# Patient Record
Sex: Male | Born: 1950 | Race: White | Hispanic: No | Marital: Married | Smoking: Never smoker
Health system: Southern US, Community
[De-identification: ages and names within clinical notes are randomized; demographics above are authoritative.]

## PROBLEM LIST (undated history)

## (undated) DIAGNOSIS — K649 Unspecified hemorrhoids: Secondary | ICD-10-CM

## (undated) DIAGNOSIS — Z87442 Personal history of urinary calculi: Secondary | ICD-10-CM

## (undated) DIAGNOSIS — K219 Gastro-esophageal reflux disease without esophagitis: Secondary | ICD-10-CM

## (undated) DIAGNOSIS — I10 Essential (primary) hypertension: Secondary | ICD-10-CM

## (undated) DIAGNOSIS — E785 Hyperlipidemia, unspecified: Secondary | ICD-10-CM

## (undated) DIAGNOSIS — E119 Type 2 diabetes mellitus without complications: Secondary | ICD-10-CM

## (undated) HISTORY — DX: Personal history of urinary calculi: Z87.442

## (undated) HISTORY — DX: Unspecified hemorrhoids: K64.9

## (undated) HISTORY — DX: Gastro-esophageal reflux disease without esophagitis: K21.9

## (undated) HISTORY — PX: COLONOSCOPY: SHX174

## (undated) HISTORY — PX: OTHER SURGICAL HISTORY: SHX169

## (undated) HISTORY — PX: LITHOTRIPSY: SUR834

## (undated) HISTORY — DX: Hyperlipidemia, unspecified: E78.5

## (undated) HISTORY — DX: Type 2 diabetes mellitus without complications: E11.9

---

## 2019-04-03 ENCOUNTER — Other Ambulatory Visit: Payer: Self-pay

## 2019-04-03 ENCOUNTER — Emergency Department (HOSPITAL_COMMUNITY): Payer: Medicare Other

## 2019-04-03 ENCOUNTER — Inpatient Hospital Stay (HOSPITAL_COMMUNITY)
Admission: EM | Admit: 2019-04-03 | Discharge: 2019-04-07 | DRG: 312 | Disposition: A | Discharge status: Home / Self Care | Payer: Medicare Other | Attending: Internal Medicine | Admitting: Internal Medicine

## 2019-04-03 ENCOUNTER — Encounter (HOSPITAL_COMMUNITY): Payer: Self-pay | Admitting: Emergency Medicine

## 2019-04-03 DIAGNOSIS — S0001XA Abrasion of scalp, initial encounter: Secondary | ICD-10-CM | POA: Diagnosis present

## 2019-04-03 DIAGNOSIS — I951 Orthostatic hypotension: Secondary | ICD-10-CM | POA: Diagnosis not present

## 2019-04-03 DIAGNOSIS — R55 Syncope and collapse: Secondary | ICD-10-CM

## 2019-04-03 DIAGNOSIS — Z87442 Personal history of urinary calculi: Secondary | ICD-10-CM

## 2019-04-03 DIAGNOSIS — R011 Cardiac murmur, unspecified: Secondary | ICD-10-CM | POA: Diagnosis present

## 2019-04-03 DIAGNOSIS — I10 Essential (primary) hypertension: Secondary | ICD-10-CM | POA: Insufficient documentation

## 2019-04-03 DIAGNOSIS — Z20828 Contact with and (suspected) exposure to other viral communicable diseases: Secondary | ICD-10-CM | POA: Diagnosis present

## 2019-04-03 DIAGNOSIS — W010XXA Fall on same level from slipping, tripping and stumbling without subsequent striking against object, initial encounter: Secondary | ICD-10-CM | POA: Diagnosis present

## 2019-04-03 DIAGNOSIS — S7002XA Contusion of left hip, initial encounter: Secondary | ICD-10-CM

## 2019-04-03 DIAGNOSIS — Z8249 Family history of ischemic heart disease and other diseases of the circulatory system: Secondary | ICD-10-CM

## 2019-04-03 DIAGNOSIS — K219 Gastro-esophageal reflux disease without esophagitis: Secondary | ICD-10-CM | POA: Diagnosis present

## 2019-04-03 DIAGNOSIS — D62 Acute posthemorrhagic anemia: Secondary | ICD-10-CM

## 2019-04-03 HISTORY — DX: Essential (primary) hypertension: I10

## 2019-04-03 HISTORY — DX: Personal history of urinary calculi: Z87.442

## 2019-04-03 LAB — CBC WITH DIFFERENTIAL/PLATELET
Abs Immature Granulocytes: 0.12 10*3/uL — ABNORMAL HIGH (ref 0.00–0.07)
Abs Immature Granulocytes: 0.06 10*3/uL (ref 0.00–0.07)
Basophils Absolute: 0 10*3/uL (ref 0.0–0.1)
Basophils Absolute: 0 10*3/uL (ref 0.0–0.1)
Basophils Relative: 0 %
Basophils Relative: 0 %
Eosinophils Absolute: 0 10*3/uL (ref 0.0–0.5)
Eosinophils Absolute: 0 10*3/uL (ref 0.0–0.5)
Eosinophils Relative: 0 %
Eosinophils Relative: 0 %
HCT: 38.2 % — ABNORMAL LOW (ref 39.0–52.0)
HCT: 36.3 % — ABNORMAL LOW (ref 39.0–52.0)
Hemoglobin: 12.3 g/dL — ABNORMAL LOW (ref 13.0–17.0)
Hemoglobin: 11.9 g/dL — ABNORMAL LOW (ref 13.0–17.0)
Immature Granulocytes: 1 %
Immature Granulocytes: 0 %
Lymphocytes Relative: 6 %
Lymphocytes Relative: 8 %
Lymphs Abs: 0.9 10*3/uL (ref 0.7–4.0)
Lymphs Abs: 1.1 10*3/uL (ref 0.7–4.0)
MCH: 29.3 pg (ref 26.0–34.0)
MCH: 29.5 pg (ref 26.0–34.0)
MCHC: 32.2 g/dL (ref 30.0–36.0)
MCHC: 32.8 g/dL (ref 30.0–36.0)
MCV: 91 fL (ref 80.0–100.0)
MCV: 89.9 fL (ref 80.0–100.0)
Monocytes Absolute: 0.8 10*3/uL (ref 0.1–1.0)
Monocytes Absolute: 0.9 10*3/uL (ref 0.1–1.0)
Monocytes Relative: 5 %
Monocytes Relative: 6 %
Neutro Abs: 14.9 10*3/uL — ABNORMAL HIGH (ref 1.7–7.7)
Neutro Abs: 12.5 10*3/uL — ABNORMAL HIGH (ref 1.7–7.7)
Neutrophils Relative %: 88 %
Neutrophils Relative %: 86 %
Platelets: 303 10*3/uL (ref 150–400)
Platelets: 335 10*3/uL (ref 150–400)
RBC: 4.2 MIL/uL — ABNORMAL LOW (ref 4.22–5.81)
RBC: 4.04 MIL/uL — ABNORMAL LOW (ref 4.22–5.81)
RDW: 13.1 % (ref 11.5–15.5)
RDW: 13.1 % (ref 11.5–15.5)
WBC: 16.8 10*3/uL — ABNORMAL HIGH (ref 4.0–10.5)
WBC: 14.6 10*3/uL — ABNORMAL HIGH (ref 4.0–10.5)
nRBC: 0 % (ref 0.0–0.2)
nRBC: 0 % (ref 0.0–0.2)

## 2019-04-03 LAB — BASIC METABOLIC PANEL
Anion gap: 11 (ref 5–15)
BUN: 19 mg/dL (ref 8–23)
CO2: 24 mmol/L (ref 22–32)
Calcium: 9 mg/dL (ref 8.9–10.3)
Chloride: 104 mmol/L (ref 98–111)
Creatinine, Ser: 1.08 mg/dL (ref 0.61–1.24)
GFR calc Af Amer: >60 mL/min (ref >60)
GFR calc non Af Amer: >60 mL/min (ref >60)
Glucose, Bld: 109 mg/dL — ABNORMAL HIGH (ref 70–99)
Potassium: 4.2 mmol/L (ref 3.5–5.1)
Sodium: 139 mmol/L (ref 135–145)

## 2019-04-03 LAB — CBG MONITORING, ED: Glucose-Capillary: 104 mg/dL — ABNORMAL HIGH (ref 70–99)

## 2019-04-03 LAB — HIV ANTIBODY (ROUTINE TESTING W REFLEX): HIV Screen 4th Generation wRfx: NONREACTIVE

## 2019-04-03 MED ORDER — ACETAMINOPHEN 650 MG RE SUPP: 650.0000 mg | Freq: Four times a day (QID) | RECTAL | Status: DC | PRN

## 2019-04-03 MED ORDER — HYDROCODONE-ACETAMINOPHEN 5-325 MG PO TABS
1.0000 | ORAL_TABLET | Freq: Four times a day (QID) | ORAL | Status: DC | PRN
  Administered 2019-04-05 (×2): 1 via ORAL
  Filled 2019-04-03 (×2): qty 1

## 2019-04-03 MED ORDER — SODIUM CHLORIDE 0.9% FLUSH
3.0000 mL | Freq: Two times a day (BID) | INTRAVENOUS | Status: DC
  Administered 2019-04-03 – 2019-04-06 (×5): 3 mL via INTRAVENOUS

## 2019-04-03 MED ORDER — ACETAMINOPHEN 325 MG PO TABS: 650.0000 mg | ORAL_TABLET | Freq: Four times a day (QID) | ORAL | Status: DC | PRN

## 2019-04-03 MED ORDER — IOHEXOL 300 MG/ML  SOLN
100.0000 mL | Freq: Once | INTRAMUSCULAR | Status: AC | PRN
  Administered 2019-04-03: 100 mL via INTRAVENOUS

## 2019-04-03 MED ORDER — SODIUM CHLORIDE 0.9 % IV BOLUS
1000.0000 mL | Freq: Once | INTRAVENOUS | Status: AC
  Administered 2019-04-03: 1000 mL via INTRAVENOUS

## 2019-04-03 NOTE — H&P (Addendum)
Date: 04/03/2019               Patient Name:  Daniel Mcdowell MRN: [id number]  DOB: 12-Oct-1950 Age / Sex: 68 y.o., male   PCP: Dois Davenport, MD         Medical Service: Internal Medicine Teaching Service         Attending Physician: Dr. Reymundo Poll, MD    First Contact: Ephriam Knuckles, MD, Rylee Pager: [phone])  Second Contact: Cleaster Corin DO, Jaimie PagerDuane Lope [phone])       After Hours (After 5p/  First Contact Pager: [phone]  weekends / holidays): Second Contact Pager: [phone]   Chief Complaint: left hip pain  History of Present Illness: 68 y.o. male w/ PMHx of hypertension and nephrolithiasis presenting with left hip pain and bruising following a mechanical fall earlier today. History obtained by patient and chart review. Patient reports that he was in his usual state of health until earlier today when he was going down his wooden steps that were covered with wet leaves and slipped. He fell onto the left side and landed on his left lateral hip and left elbow. He denies any head trauma or loss of consciousness with this fall. He was able to ambulate back into the house without any difficulty. He has been icing to the left hip which reportedly helped with the pain. Shortly after that, when he was in the bathroom he had an episode where he felt diaphoretic and was told by his wife that he looked clammy. He had another fall and hit his head on the back of the door. He does not believe he lost consciousness with this fall. He denies any fevers/chills, headache, lightheadedness/dizziness, chest pain, shortness of breath, nausea/vomiting, abdominal pain, changes in bowel habits, urinary symptoms, generalized or focal weakness.   At baseline, patient is active and denies any prior syncopal episodes, chest pain or dyspnea on exertion.    ED Course:  Patient presented with significant swelling to left lateral hip after mechanical fall with a near-syncopal episode causing him to  hit the back of his head on the door. No restrictions in ROM or ambulation. In the ED, nurse noted patient to have syncopal episode; however, when questioned, patient stated that he was just resting his eyes. X-ray of left hip negative for fracture. CT imaging concerning for large soft tissue hematoma without active extravasation. However, given multiple near-syncopal episodes, patient admitted to internal medicine for further evaluation and management.    Meds:  No outpatient medications have been marked as taking for the 04/03/19 encounter [facility] Encounter).  Lisinopril 40mg  qd Aspiring 81mg  qd Omeprazole  Multivitamins    Allergies: Allergies as of 04/03/2019   (Not on File)   Past Medical History:  Diagnosis Date   History of nephrolithiasis    Hypertension     Family History:  Family History  Problem Relation Age of Onset   Heart attack Father    Diabetes Sister    COPD Sister      Social History:  Social History   Tobacco Use   Smoking status: Never Smoker  Substance Use Topics   Alcohol use: Yes    Comment: 1 beer every 6 months or so    Drug use: Never     Review of Systems: A complete ROS was negative except as per HPI.   Physical Exam: Blood pressure 111/81, pulse (!) 108, temperature 99.7 F (37.6 C), temperature source Oral, resp. rate 17,  SpO2 97 %. Physical Exam Vitals signs and nursing note reviewed.  Constitutional:      General: He is not in acute distress.    Appearance: Normal appearance. He is not ill-appearing or diaphoretic.  HENT:     Head: Normocephalic and atraumatic.     Mouth/Throat:     Mouth: Mucous membranes are moist.     Pharynx: Oropharynx is clear. No oropharyngeal exudate or posterior oropharyngeal erythema.  Eyes:     General: No scleral icterus.    Extraocular Movements: Extraocular movements intact.     Conjunctiva/sclera: Conjunctivae normal.  Neck:     Musculoskeletal: Normal range of motion and neck  supple. No muscular tenderness.  Cardiovascular:     Rate and Rhythm: Regular rhythm. Tachycardia present.     Pulses: Normal pulses.     Heart sounds: Murmur present. No friction rub. No gallop.      Comments: Systolic murmur in 2nd ICS on left  Pulmonary:     Effort: Pulmonary effort is normal. No respiratory distress.     Breath sounds: Normal breath sounds. No wheezing, rhonchi or rales.  Abdominal:     General: Bowel sounds are normal. There is no distension.     Palpations: Abdomen is soft.     Tenderness: There is no abdominal tenderness. There is no guarding or rebound.  Musculoskeletal: Normal range of motion.        General: Tenderness present. No swelling or deformity.  Skin:    General: Skin is warm and dry.     Capillary Refill: Capillary refill takes less than 2 seconds.     Findings: Bruising present.     Comments: Mild bruising noted at left medial elbow without significant tenderness to palpation or restrictions in ROM  Left thigh with significant bruising (see picture below) that is firm with tenderness to palpation; however no restriction in ROM   Neurological:     General: No focal deficit present.     Mental Status: He is alert and oriented to person, place, and time. Mental status is at baseline.     Cranial Nerves: No cranial nerve deficit.     Sensory: No sensory deficit.     Motor: No weakness.     Coordination: Coordination normal.     Gait: Gait normal.      EKG: personally reviewed my interpretation is normal sinus rhythm  CT HEAD WO CONTRAST: IMPRESSION: 1. No acute intracranial abnormality. 2. Mild occipital and lower right parietal scalp infiltration. 3. Debris within the external auditory canals, likely cerumen impaction.  X-RAY LEFT HIP: Negative for hip fracture or dislocation.   CT LEFT HIP W CONTRAST:  IMPRESSION: 1. Large 11.4 x 4.5 x 18.5 cm superficial soft tissue hematoma overlying the greater trochanter and proximal  subtrochanteric femur. No contrast extravasation. 2.  No acute osseous abnormality.  Assessment & Plan by Problem:  Patient is a 68yo male with history of HTN and nephrolithiasis presenting after a mechanical fall resulting in a left hip hematoma and multiple subsequent presyncopal episodes likely vasovagal in nature.   Presyncopal episode:  Patient reports that he had a near syncopal episode after using the bathroom at home when he was noted to be diaphoretic and "clammy" by his wife. He reports falling and hitting the back of his head during this episode but denies any LOC. In the ED, patient presumably had a near-syncopal episode according to nursing but reports that he was just resting his eyes. On exam,  patient is tachycardic but otherwise hemodynamically stable. Likely this was vasovagal in nature. However, patient does have new systolic murmur at left second intercostal space so will r/o any valvular causes.  - Cardiac monitoring - Echo  Left hip hematoma:  Patient has a large superficial soft tissue hematoma overlying the greater trochanter proximal femur without active extravasation after a mechanical fall. He is not on any anticoagulation. Pain is improved with icing. Although firm and tender to palpation, there is no restrictions in the patient's ROM of the left hip. Distal pulses palpable. He is able to ambulate without any significant discomfort.  - CBC - PT/OT evaluation - Ice to affected area  - Hydrocodone-acetaminophen 1 tablet q6h prn  History of hypertension:  Patient has history of hypertension and reports taking lisinopril 40mg  qd. Patient is normotensive on admission. - Holding BP meds for now  Diet: Heart healthy Code: FULL  VTE Prophylaxis: SCDs   Dispo: Admit patient to Observation with expected length of stay less than 2 midnights.  Signed: Harvie Heck, MD  Internal Medicine, PGY-1  04/03/2019, 10:07 PM  Pager: [phone]

## 2019-04-03 NOTE — ED Provider Notes (Signed)
[facility]   CSN: [id number] Arrival date & time: 04/03/19  1427     History   Chief Complaint Chief Complaint  Patient presents with   Fall    HPI Daniel Mcdowell is a 68 y.o. male without significant PMHx, presenting to the ED with left hip pain and bruising after a mechanical fall earlier today.  States he was walking outside on his back steps in the rain and they were covered with wet leaves.  He slipped and fell onto his left side landing on his left lateral hip and elbow.  He states he had sudden pain in the left hip was able to ambulate back to his house.  When he was in the restroom shortly after he had a near syncopal episode with lightheadedness and fell backwards hitting his head on the door.  He states he does not believe he lost full consciousness.  He is not complaining of headache, vision changes, nausea or vomiting, neck or back pain, numbness or weakness in extremities.  He has applied ice to his left hip for symptoms..  Patient is not on anticoagulation.     The history is provided by the patient.    History reviewed. No pertinent past medical history.  There are no active problems to display for this patient.   History reviewed. No pertinent surgical history.      Home Medications    Prior to Admission medications   Not on File    Family History No family history on file.  Social History Social History   Tobacco Use   Smoking status: Not on file  Substance Use Topics   Alcohol use: Not on file   Drug use: Not on file     Allergies   Patient has no allergy information on record.   Review of Systems Review of Systems  All other systems reviewed and are negative.    Physical Exam Updated Vital Signs BP 124/77    Pulse 96    Temp 98.2 F (36.8 C) (Oral)    Resp 19    SpO2 97%   Physical Exam Vitals signs and nursing note reviewed.  Constitutional:      General: He is not in  acute distress.    Appearance: He is well-developed.  HENT:     Head: Normocephalic.     Comments: Superficial abrasion to right occipital scalp.  No large hematoma or tenderness. Eyes:     Conjunctiva/sclera: Conjunctivae normal.  Neck:     Musculoskeletal: Normal range of motion and neck supple. No muscular tenderness.  Cardiovascular:     Rate and Rhythm: Normal rate and regular rhythm.  Pulmonary:     Effort: Pulmonary effort is normal. No respiratory distress.     Breath sounds: Normal breath sounds.  Abdominal:     General: Bowel sounds are normal.     Palpations: Abdomen is soft.     Tenderness: There is no abdominal tenderness.  Musculoskeletal:     Comments: No midline spinal or paraspinal tenderness, no bony step-offs or gross deformities.  Left elbow just distal to the olecranon process with hematoma and bruising.  Mild tenderness, full normal range of motion of the elbow without pain. Left lateral hip/proximal thigh with large swelling and an area of ecchymosis.  The swelling extends from just below the iliac crest to the proximal lateral left thigh.  It is very firm to palpation.  Slightly tender.  No pain in  the groin with internal and external rotation of the hip and patient is able to fully range the hip without difficulty.  No obvious deformity.  Skin:    General: Skin is warm.  Neurological:     Mental Status: He is alert.     Comments: Mental Status:  Alert, oriented, thought content appropriate, able to give a coherent history. Speech fluent without evidence of aphasia. Able to follow 2 step commands without difficulty.  Cranial Nerves:  II:  Peripheral visual fields grossly normal, pupils equal, round, reactive to light III,IV, VI: ptosis not present, extra-ocular motions intact bilaterally  V,VII: smile symmetric, facial light touch sensation equal VIII: hearing grossly normal to voice  X: uvula elevates symmetrically  XI: bilateral shoulder shrug symmetric and  strong XII: midline tongue extension without fassiculations Motor:  Normal tone. 5/5 in upper and lower extremities bilaterally including strong and equal grip strength and dorsiflexion/plantar flexion Sensory: grossly normal in all extremities.  Cerebellar: normal finger-to-nose with bilateral upper extremities Gait: normal gait and balance CV: distal pulses palpable throughout    Psychiatric:        Behavior: Behavior normal.      ED Treatments / Results  Labs (all labs ordered are listed, but only abnormal results are displayed) Labs Reviewed  CBC WITH DIFFERENTIAL/PLATELET - Abnormal; Notable for the following components:      Result Value   WBC 16.8 (*)    RBC 4.20 (*)    Hemoglobin 12.3 (*)    HCT 38.2 (*)    Neutro Abs 14.9 (*)    Abs Immature Granulocytes 0.12 (*)    All other components within normal limits  BASIC METABOLIC PANEL - Abnormal; Notable for the following components:   Glucose, Bld 109 (*)    All other components within normal limits  CBG MONITORING, ED - Abnormal; Notable for the following components:   Glucose-Capillary 104 (*)    All other components within normal limits    EKG EKG Interpretation  Date/Time:  Thursday April 03 2019 16:59:52 EDT Ventricular Rate:  99 PR Interval:    QRS Duration: 100 QT Interval:  356 QTC Calculation: 457 R Axis:   77 Text Interpretation: Sinus rhythm Baseline wander in lead(s) II III aVL aVF no prior available for comparison Confirmed by Tilden Fossa [phone]) on 04/03/2019 6:56:58 PM   Radiology Dg Hip Unilat With Pelvis 2-3 Views Left  Result Date: 04/03/2019 CLINICAL DATA:  Left leg pain after fall EXAM: DG HIP (WITH OR WITHOUT PELVIS) 2-3V LEFT COMPARISON:  None. FINDINGS: There is no evidence of hip fracture or dislocation. There is no evidence of arthropathy or other focal bone abnormality. IMPRESSION: Negative. Electronically Signed   By: Duanne Guess M.D.   On: 04/03/2019 15:04     Procedures Procedures (including critical care time)  Medications Ordered in ED Medications  iohexol (OMNIPAQUE) 300 MG/ML solution 100 mL (100 mLs Intravenous Contrast Given 04/03/19 1854)     Initial Impression / Assessment and Plan / ED Course  I have reviewed the triage vital signs and the nursing notes.  Pertinent labs & imaging results that were available during my care of the patient were reviewed by me and considered in my medical decision making (see chart for details).       Patient presenting with significant swelling to left lateral hip after a mechanical fall earlier today.  He then had a near syncopal episode that caused him to strike the back of his head on  a door that was preceded by lightheadedness afterwards.  On exam, he is well-appearing and in no distress.  He has been able to ambulate on the left leg since the injury.  There is significant swelling with a large area of ecchymosis to the lateral left hip, and is very firm to the touch.  Patient is able to range the hip without difficulty.  Plain film of the left hip is negative for fracture.  Given patient's significant swelling on exam, patient was discussed with Dr. Ralene Bathe who evaluated patient at bedside.  We will proceed with labs and advanced imaging.  Concern for active extravasation of hematoma.  Patient has not requiring any pain medication at this time.  Ice applied for swelling.    Labs reveal leukocytosis of 16.8, hemoglobin of 12.3, no significant electrolyte derangements.  Vital signs remained stable. Care assumed at shift change by PA Couture, pending CT imaging and re-evaluation.  Final Clinical Impressions(s) / ED Diagnoses   Final diagnoses:  None    ED Discharge Orders    None       Javaya [place], [place] N, PA-C 04/03/19 1904    Quintella Reichert, MD 04/05/19 1819

## 2019-04-03 NOTE — Progress Notes (Signed)
MD notified of patient condition

## 2019-04-03 NOTE — Progress Notes (Addendum)
The nurse was unable to complete the standing orthostatic vitals. Patient passed out while standing, was sweating excessively. Placed on 2 liters of oxygen. RR was called. Patient says I feel normal now.

## 2019-04-03 NOTE — ED Triage Notes (Signed)
Pt to ER for evaluation after falling down some wet steps at his home. Reports pain to left leg, is able to ambulate however. Also has small abrasion to posterior head. No LOC reported. A/o x4 at this time. Not on anticoagulation.

## 2019-04-03 NOTE — ED Notes (Signed)
Transported to CT 

## 2019-04-03 NOTE — Significant Event (Signed)
Rapid Response Event Note  Overview:Called d/t syncopal episode when standing for orthostatic VS. Per RN, pt got diaphoretic, and passed out for a few seconds once he stood up to get orthostatic VS. BP was 100/74 while laying in the bed and had dropped to 81/55 while sitting, then pt stood and passed out.  When pt woke up, he was alert and oriented, but didn't remember what had happened. RR RN asked RN to lay pt back in the back and repeat BP. Pt here for near-syncopal event/fall at home yielding large L hip hematoma. Time Called: 2222 Arrival Time: 2230 Event Type: Neurologic, Hypotension  Initial Focused Assessment: Pt laying in the bed, alert and oriented, concerned about what just happened. He says he doesn't remember what happened but he does remember having a lot of pain in his L hip once he stood up. EKG strip reviewed with no change in HR, though there was a brief period of time during event where pt was off the monitor. Lungs clear r/o, murmur heard (pt has no history of murmur), skin warm and dry.  Interventions: 1L NS bolus x 1 Bedrest tonight MD at bedside to assess pt Plan of Care (if not transferred): Please keep pt on bedrest tonight, continue to monitor, and call RRT if further assistance needed. Event Summary: Name of Physician Notified: Aslam, MD at 2240    at    Outcome: Stayed in room and stabalized  Event End Time: 2250  Dillard [place]

## 2019-04-03 NOTE — Progress Notes (Signed)
Paged by RN regarding patient becoming diaphoretic and "passing out" when trying to perform orthostatic vitals. BP was 100/74 while laying and dropped to 81/55 when sitting. On standing, patient became diaphoretic and passed out. No change in HR. RR was called who started patient on 1L bolus. Patient assessed at bedside. He is resting in bed and is alert and oriented. VSS. He reports experiencing pain in his left leg on standing, became sweaty and had to sit down. Physical exam is unchanged from prior and continues to have systolic murmur at left 2nd ICS unchanged in intensity. No significant change in the size of the hematoma. Bedside EKG with NSR. Will continue the bolus and continue on bedrest.  Suspect this is vasovagal as diaphoresis and syncope triggered by pain. Will continue to monitor.

## 2019-04-03 NOTE — ED Provider Notes (Signed)
Care assumed from Martinique Robinson, PA-C at shift change pending CT of the left hip and reassessment.  See her note for full H&P  Per her note, "Daniel Mcdowell is a 68 y.o. male without significant PMHx, presenting to the ED with left hip pain and bruising after a mechanical fall earlier today.  States he was walking outside on his back steps in the rain and they were covered with wet leaves.  He slipped and fell onto his left side landing on his left lateral hip and elbow.  He states he had sudden pain in the left hip was able to ambulate back to his house.  When he was in the restroom shortly after he had a near syncopal episode with lightheadedness and fell backwards hitting his head on the door.  He states he does not believe he lost full consciousness.  He is not complaining of headache, vision changes, nausea or vomiting, neck or back pain, numbness or weakness in extremities.  He has applied ice to his left hip for symptoms..  Patient is not on anticoagulation."    Walking outside, slippery steps and fall on left hip. Large amount of swelling on hip.  Near syncope, fall and hit head on door.  Ralene Bathe has seen  Labs, ekg are fine  CT left hip to r/o extravasation of hematoma, if neg and if he feels ok maybe discharge vs admit for obs for syncope  Physical Exam  BP 124/77   Pulse 96   Temp 98.2 F (36.8 C) (Oral)   Resp 19   SpO2 97%   Physical Exam Vitals signs and nursing note reviewed.  Constitutional:      Appearance: He is well-developed.  HENT:     Head: Normocephalic and atraumatic.  Eyes:     Conjunctiva/sclera: Conjunctivae normal.  Neck:     Musculoskeletal: Neck supple.  Cardiovascular:     Rate and Rhythm: Regular rhythm. Tachycardia present.     Heart sounds: Murmur present.  Pulmonary:     Effort: Pulmonary effort is normal. No respiratory distress.     Breath sounds: Normal breath sounds.  Abdominal:     Palpations: Abdomen is soft.  Musculoskeletal:     Comments:  Mild tenderness to the left hip with extensive swelling and firm hematoma noted from the left buttock to the left upper thigh.  Also with swelling/ecchymosis to the left elbow.  Good range of motion of the extremities.  Skin:    General: Skin is warm and dry.  Neurological:     Mental Status: He is alert.       ED Course/Procedures     Procedures Results for orders placed or performed during the hospital encounter of 04/03/19  CBC with Differential  Result Value Ref Range   WBC 16.8 (H) 4.0 - 10.5 K/uL   RBC 4.20 (L) 4.22 - 5.81 MIL/uL   Hemoglobin 12.3 (L) 13.0 - 17.0 g/dL   HCT 38.2 (L) 39.0 - 52.0 %   MCV 91.0 80.0 - 100.0 fL   MCH 29.3 26.0 - 34.0 pg   MCHC 32.2 30.0 - 36.0 g/dL   RDW 13.1 11.5 - 15.5 %   Platelets 303 150 - 400 K/uL   nRBC 0.0 0.0 - 0.2 %   Neutrophils Relative % 88 %   Neutro Abs 14.9 (H) 1.7 - 7.7 K/uL   Lymphocytes Relative 6 %   Lymphs Abs 0.9 0.7 - 4.0 K/uL   Monocytes Relative 5 %  Monocytes Absolute 0.8 0.1 - 1.0 K/uL   Eosinophils Relative 0 %   Eosinophils Absolute 0.0 0.0 - 0.5 K/uL   Basophils Relative 0 %   Basophils Absolute 0.0 0.0 - 0.1 K/uL   Immature Granulocytes 1 %   Abs Immature Granulocytes 0.12 (H) 0.00 - 0.07 K/uL  Basic metabolic panel  Result Value Ref Range   Sodium 139 135 - 145 mmol/L   Potassium 4.2 3.5 - 5.1 mmol/L   Chloride 104 98 - 111 mmol/L   CO2 24 22 - 32 mmol/L   Glucose, Bld 109 (H) 70 - 99 mg/dL   BUN 19 8 - 23 mg/dL   Creatinine, Ser 6.961.08 0.61 - 1.24 mg/dL   Calcium 9.0 8.9 - 29.510.3 mg/dL   GFR calc non Af Amer >60 >60 mL/min   GFR calc Af Amer >60 >60 mL/min   Anion gap 11 5 - 15  CBG monitoring, ED  Result Value Ref Range   Glucose-Capillary 104 (H) 70 - 99 mg/dL   Ct Head Wo Contrast  Result Date: 04/03/2019 CLINICAL DATA:  Head trauma, fall, posterior abrasion EXAM: CT HEAD WITHOUT CONTRAST TECHNIQUE: Contiguous axial images were obtained from the base of the skull through the vertex without  intravenous contrast. COMPARISON:  None. FINDINGS: Brain: No evidence of acute infarction, hemorrhage, hydrocephalus, extra-axial collection or mass lesion/mass effect. Symmetric prominence of the ventricles, cisterns and sulci compatible with parenchymal volume loss. Patchy areas of white matter hypoattenuation are most compatible with chronic microvascular angiopathy. Vascular: Atherosclerotic calcification of the carotid siphons. No hyperdense vessel. Skull: Mild occipital and right parietal scalp infiltration. No subjacent calvarial suspicious osseous lesion. Punctate subcutaneous mineralization seen more superiorly towards the right parietal convexity. Sinuses/Orbits: Mild nodular pansinus mural thickening. No air-fluid levels. Mastoid air cells are clear. Middle ear cavities are clear. Impacted debris within the external auditory canals. Included orbital structures are unremarkable. Other: None IMPRESSION: 1. No acute intracranial abnormality. 2. Mild occipital and lower right parietal scalp infiltration. 3. Debris within the external auditory canals, likely cerumen impaction. Electronically Signed   By: Kreg ShropshirePrice  DeHay M.D.   On: 04/03/2019 19:20   Ct Hip Left W Contrast  Result Date: 04/03/2019 CLINICAL DATA:  Left leg pain after fall. EXAM: CT OF THE LOWER LEFT EXTREMITY WITH CONTRAST TECHNIQUE: Multidetector CT imaging of the left hip was performed according to the standard protocol following intravenous contrast administration. COMPARISON:  Left hip x-rays from same day. CONTRAST:  100mL OMNIPAQUE IOHEXOL 300 MG/ML  SOLN FINDINGS: Bones/Joint/Cartilage No fracture or dislocation. Normal alignment. No joint effusion. Ligaments Ligaments are suboptimally evaluated by CT. Muscles and Tendons Grossly intact.  No muscle atrophy. Soft tissue Large 11.4 x 4.5 x 18.5 cm superficial soft tissue hematoma overlying the greater trochanter and proximal subtrochanteric femur. Overlying superficial soft tissue  swelling and hemorrhage. No active extravasation. Small fat containing left inguinal hernia. IMPRESSION: 1. Large 11.4 x 4.5 x 18.5 cm superficial soft tissue hematoma overlying the greater trochanter and proximal subtrochanteric femur. No contrast extravasation. 2.  No acute osseous abnormality. Electronically Signed   By: Obie DredgeWilliam T Derry M.D.   On: 04/03/2019 19:33   Dg Hip Unilat With Pelvis 2-3 Views Left  Result Date: 04/03/2019 CLINICAL DATA:  Left leg pain after fall EXAM: DG HIP (WITH OR WITHOUT PELVIS) 2-3V LEFT COMPARISON:  None. FINDINGS: There is no evidence of hip fracture or dislocation. There is no evidence of arthropathy or other focal bone abnormality. IMPRESSION: Negative. Electronically Signed  By: Duanne Guess M.D.   On: 04/03/2019 15:04     MDM   Briefly, 68 year old male presenting initially for mechanical fall and left hip pain.  However after the fall, he had a syncopal episode and another near syncopal episode where he sustained head trauma.  He has some extensive hematoma to the left hip and at signout he was pending CT imaging of the left hip to rule out extravasation into the joint.  CT showed a large superficial soft tissue hematoma overlying the greater trochanter and proximal subtrochanteric femur.  No contracts extravasation.  The remainder of patient's work-up was largely benign.  Given his multiple near syncopal/syncopal episodes following this injury patient was admitted for observation.  Case was discussed with Dr. Madilyn Hook who personally evaluated the patient and also recommends admitting the patient for syncope.  8:41 PM discussed case with Dr. Evelene Croon with internal medicine residency who accepts patient for admission.  She does recommend adding repeat CBC at this time.     Rayne Du 04/03/19 2047    Tilden Fossa, MD 04/05/19 Windy Fast

## 2019-04-04 ENCOUNTER — Other Ambulatory Visit: Payer: Self-pay

## 2019-04-04 ENCOUNTER — Observation Stay (HOSPITAL_BASED_OUTPATIENT_CLINIC_OR_DEPARTMENT_OTHER): Payer: Medicare Other

## 2019-04-04 DIAGNOSIS — I10 Essential (primary) hypertension: Secondary | ICD-10-CM

## 2019-04-04 DIAGNOSIS — I361 Nonrheumatic tricuspid (valve) insufficiency: Secondary | ICD-10-CM

## 2019-04-04 DIAGNOSIS — Z87442 Personal history of urinary calculi: Secondary | ICD-10-CM

## 2019-04-04 DIAGNOSIS — R55 Syncope and collapse: Secondary | ICD-10-CM | POA: Diagnosis not present

## 2019-04-04 DIAGNOSIS — I951 Orthostatic hypotension: Secondary | ICD-10-CM

## 2019-04-04 DIAGNOSIS — Z7982 Long term (current) use of aspirin: Secondary | ICD-10-CM

## 2019-04-04 DIAGNOSIS — W109XXA Fall (on) (from) unspecified stairs and steps, initial encounter: Secondary | ICD-10-CM

## 2019-04-04 DIAGNOSIS — I340 Nonrheumatic mitral (valve) insufficiency: Secondary | ICD-10-CM

## 2019-04-04 DIAGNOSIS — S7012XA Contusion of left thigh, initial encounter: Secondary | ICD-10-CM

## 2019-04-04 DIAGNOSIS — S5002XA Contusion of left elbow, initial encounter: Secondary | ICD-10-CM | POA: Diagnosis not present

## 2019-04-04 DIAGNOSIS — Z79899 Other long term (current) drug therapy: Secondary | ICD-10-CM

## 2019-04-04 DIAGNOSIS — R011 Cardiac murmur, unspecified: Secondary | ICD-10-CM

## 2019-04-04 DIAGNOSIS — K219 Gastro-esophageal reflux disease without esophagitis: Secondary | ICD-10-CM

## 2019-04-04 LAB — CBC
HCT: 30.5 % — ABNORMAL LOW (ref 39.0–52.0)
Hemoglobin: 10.2 g/dL — ABNORMAL LOW (ref 13.0–17.0)
MCH: 29.6 pg (ref 26.0–34.0)
MCHC: 33.4 g/dL (ref 30.0–36.0)
MCV: 88.4 fL (ref 80.0–100.0)
Platelets: 275 10*3/uL (ref 150–400)
RBC: 3.45 MIL/uL — ABNORMAL LOW (ref 4.22–5.81)
RDW: 13.2 % (ref 11.5–15.5)
WBC: 11 10*3/uL — ABNORMAL HIGH (ref 4.0–10.5)
nRBC: 0 % (ref 0.0–0.2)

## 2019-04-04 LAB — ECHOCARDIOGRAM COMPLETE

## 2019-04-04 LAB — SARS CORONAVIRUS 2 (TAT 6-24 HRS): SARS Coronavirus 2: NEGATIVE

## 2019-04-04 MED ORDER — SODIUM CHLORIDE 0.9 % IV BOLUS
1000.0000 mL | Freq: Once | INTRAVENOUS | Status: AC
  Administered 2019-04-04: 1000 mL via INTRAVENOUS

## 2019-04-04 MED ORDER — SODIUM CHLORIDE 0.9 % IV SOLN
INTRAVENOUS | Status: DC
  Administered 2019-04-04 – 2019-04-05 (×3): via INTRAVENOUS

## 2019-04-04 NOTE — Progress Notes (Addendum)
Subjective:  Saw patient today during morning rounds. Patient states that he is doing well. He notes that his wound where he fell is much "softer" with significantly less swelling than yesterday. Pain is moderate but well-controlled. He has full Range of Motion. Patient also notes that he has been able to ambulate since the accident believes he is able to leave today.   Objective:  Vital signs in last 24 hours: Vitals:   04/04/19 0020 04/04/19 0454 04/04/19 0816 04/04/19 1304  BP: 103/66 108/72 128/73 140/79  Pulse: 89 92 92 (!) 103  Resp:   16 18  Temp: 98.8 F (37.1 C) 99 F (37.2 C) 98.7 F (37.1 C) 98.9 F (37.2 C)  TempSrc: Oral Oral Oral Oral  SpO2: 100% 100% 100% 99%   Weight change:  No intake or output data in the 24 hours ending 04/04/19 1438   CBC Latest Ref Rng & Units 04/04/2019 04/03/2019 04/03/2019  WBC 4.0 - 10.5 K/uL 11.0(H) 14.6(H) 16.8(H)  Hemoglobin 13.0 - 17.0 g/dL 10.2(L) 11.9(L) 12.3(L)  Hematocrit 39.0 - 52.0 % 30.5(L) 36.3(L) 38.2(L)  Platelets 150 - 400 K/uL 275 335 303   BMP Latest Ref Rng & Units 04/03/2019  Glucose 70 - 99 mg/dL 960(A)  BUN 8 - 23 mg/dL 19  Creatinine 5.40 - 9.81 mg/dL 1.91  Sodium 478 - 295 mmol/L 139  Potassium 3.5 - 5.1 mmol/L 4.2  Chloride 98 - 111 mmol/L 104  CO2 22 - 32 mmol/L 24  Calcium 8.9 - 10.3 mg/dL 9.0    Echo: -Left ventricular diastolic parameters are consistent with Grade I diastolic dysfunction (impaired relaxation). -The mitral valve is normal in structure. Mild mitral valve regurgitation. No evidence of mitral stenosis   Examination Physical Exam  Constitutional: He is oriented to person, place, and time. He appears well-developed and well-nourished.  HENT:  Head: Normocephalic and atraumatic.  Eyes: Pupils are equal, round, and reactive to light. EOM are normal.  Neck: Normal range of motion. Neck supple. No tracheal deviation present. No thyromegaly present.  Cardiovascular: Normal rate, regular  rhythm and normal heart sounds. Exam reveals no gallop and no friction rub.  No murmur heard. Pulmonary/Chest: Effort normal and breath sounds normal. No respiratory distress. He has no wheezes. He has no rales.  Abdominal: Soft. Bowel sounds are normal. He exhibits no mass. There is no abdominal tenderness. There is no rebound.  Musculoskeletal:     Left elbow: He exhibits swelling. He exhibits no laceration. Tenderness found.     Left hip: He exhibits swelling. He exhibits no deformity and no laceration.     Comments: Notable bruising and swelling over the lateral side of left thigh and on the inside of the left elbow.  Neurological: He is alert and oriented to person, place, and time. No cranial nerve deficit.    Patient Summary Daniel Mcdowell 68 y.o. male w/ PMHx of hypertension and nephrolithiasis presenting with syncopal episode resulting in a fall with left hip hematoma and additional subsequent presyncopal episodes.  Imaging reveals hematoma over the left thigh. X rays negative for fracture/dislocation/osseous malformation. Orthostatic vital signs were positive for hypotension. Will continue to monitor patient's CBC for bleeding and repeat orthostatic vitals following fluid resuscitation  Assessment/Plan:  Principal Problem:   Near syncope Active Problems:   Hematoma of left hip   Syncope -history is significant for decreased oral intake of both food/water for 24 hours prior syncopal episode, patient states there was no specific reason why he had  decreased po intake Plan:  -continue fluid resuscitation -PT/OT evaluation prior to discharge  Hematoma of the left hip -resolving hematoma with decreased swelling and pain Plan: -continue icing area -continue pain management as needed   LOS: 0 days   Carolyne Littles, Medical Student 04/04/2019, 2:38 PM

## 2019-04-04 NOTE — Evaluation (Signed)
Physical Therapy Evaluation Patient Details Name: Daniel Mcdowell MRN: [id number] DOB: Oct 13, 1950 Today's Date: 04/04/2019   History of Present Illness  Mr. Stoklosa 68 y.o. male w/ PMHx of hypertension and nephrolithiasis presenting with syncopal episode resulting in a fall with left hip hematoma and additional subsequent presyncopal episodes. Imaging reveals hematoma over the left thigh.  Clinical Impression  Pt admitted with above. Pt with positive orthostatics with transitional movements (see vitals flowsheet), however was asymptomatic. Ambulating hallway distances independently; denies increased pain with weightbearing. Overall, left lower extremity ROM and strength are WFL. Education provided re: activity restrictions, exercise recommendations, R.I.C.E. No further acute PT needs; PT signing off.     Follow Up Recommendations No PT follow up    Equipment Recommendations  None recommended by PT    Recommendations for Other Services       Precautions / Restrictions Precautions Precautions: Other (comment) Precaution Comments: orthostatic Restrictions Weight Bearing Restrictions: No      Mobility  Bed Mobility Overal bed mobility: Independent                Transfers Overall transfer level: Independent                  Ambulation/Gait Ambulation/Gait assistance: Independent Gait Distance (Feet): 350 Feet Assistive device: None Gait Pattern/deviations: WFL(Within Functional Limits)     General Gait Details: Pt with fluid gait pattern, no gross unsteadiness  Stairs            Wheelchair Mobility    Modified Rankin (Stroke Patients Only)       Balance Overall balance assessment: No apparent balance deficits (not formally assessed)                                           Pertinent Vitals/Pain Pain Assessment: Faces Faces Pain Scale: Hurts a little bit Pain Location: left thigh Pain Descriptors / Indicators: ("hard to  touch.") Pain Intervention(s): Monitored during session    Home Living Family/patient expects to be discharged to:: Private residence Living Arrangements: Spouse/significant other Available Help at Discharge: Family Type of Home: House Home Access: [place]: One level        Prior Function Level of Independence: Independent         Comments: retired Physiological scientist, enjoys walking long distances     Journalist, newspaper        Extremity/Trunk Assessment   Upper Extremity Assessment Upper Extremity Assessment: Overall WFL for tasks assessed    Lower Extremity Assessment Lower Extremity Assessment: RLE deficits/detail;LLE deficits/detail RLE Deficits / Details: Strength 5/5 LLE Deficits / Details: Strength 5/5    Cervical / Trunk Assessment Cervical / Trunk Assessment: Normal  Communication   Communication: No difficulties  Cognition Arousal/Alertness: Awake/alert Behavior During Therapy: WFL for tasks assessed/performed Overall Cognitive Status: Within Functional Limits for tasks assessed                                        General Comments      Exercises     Assessment/Plan    PT Assessment Patent does not need any further PT services  PT Problem List Pain       PT Treatment Interventions      PT Goals (Current goals  can be found in the Care Plan section)  Acute Rehab PT Goals Patient Stated Goal: "walk and be active." PT Goal Formulation: All assessment and education complete, DC therapy    Frequency     Barriers to discharge        Co-evaluation               AM-PAC PT "6 Clicks" Mobility  Outcome Measure Help needed turning from your back to your side while in a flat bed without using bedrails?: None Help needed moving from lying on your back to sitting on the side of a flat bed without using bedrails?: None Help needed moving to and from a bed to a chair (including a wheelchair)?:  None Help needed standing up from a chair using your arms (e.g., wheelchair or bedside chair)?: None Help needed to walk in hospital room?: None Help needed climbing 3-5 steps with a railing? : None 6 Click Score: 24    End of Session Equipment Utilized During Treatment: Gait belt Activity Tolerance: Patient tolerated treatment well Patient left: in bed;with call bell/phone within reach;with bed alarm set Nurse Communication: Mobility status PT Visit Diagnosis: Dizziness and giddiness (R42)    Time: 9604-5409 PT Time Calculation (min) (ACUTE ONLY): 32 min   Charges:   PT Evaluation $PT Eval Low Complexity: 1 Low PT Treatments $Therapeutic Activity: 8-22 mins        Laurina Bustle, PT, DPT Acute Rehabilitation Services Pager [phone] Office [phone]   Vanetta Mulders 04/04/2019, 4:22 PM

## 2019-04-04 NOTE — Progress Notes (Signed)
  Echocardiogram 2D Echocardiogram has been performed.  Daniel Mcdowell 04/04/2019, 10:54 AM

## 2019-04-05 ENCOUNTER — Observation Stay (HOSPITAL_COMMUNITY): Payer: Medicare Other

## 2019-04-05 DIAGNOSIS — D649 Anemia, unspecified: Secondary | ICD-10-CM

## 2019-04-05 DIAGNOSIS — R55 Syncope and collapse: Secondary | ICD-10-CM | POA: Diagnosis present

## 2019-04-05 DIAGNOSIS — S0001XA Abrasion of scalp, initial encounter: Secondary | ICD-10-CM | POA: Diagnosis present

## 2019-04-05 DIAGNOSIS — R011 Cardiac murmur, unspecified: Secondary | ICD-10-CM | POA: Diagnosis present

## 2019-04-05 DIAGNOSIS — D62 Acute posthemorrhagic anemia: Secondary | ICD-10-CM | POA: Diagnosis present

## 2019-04-05 DIAGNOSIS — R82998 Other abnormal findings in urine: Secondary | ICD-10-CM | POA: Diagnosis not present

## 2019-04-05 DIAGNOSIS — W19XXXA Unspecified fall, initial encounter: Secondary | ICD-10-CM

## 2019-04-05 DIAGNOSIS — Z20828 Contact with and (suspected) exposure to other viral communicable diseases: Secondary | ICD-10-CM | POA: Diagnosis present

## 2019-04-05 DIAGNOSIS — W01198A Fall on same level from slipping, tripping and stumbling with subsequent striking against other object, initial encounter: Secondary | ICD-10-CM | POA: Diagnosis not present

## 2019-04-05 DIAGNOSIS — Z9889 Other specified postprocedural states: Secondary | ICD-10-CM | POA: Diagnosis not present

## 2019-04-05 DIAGNOSIS — W010XXA Fall on same level from slipping, tripping and stumbling without subsequent striking against object, initial encounter: Secondary | ICD-10-CM | POA: Diagnosis present

## 2019-04-05 DIAGNOSIS — I951 Orthostatic hypotension: Secondary | ICD-10-CM | POA: Diagnosis present

## 2019-04-05 DIAGNOSIS — S7002XA Contusion of left hip, initial encounter: Secondary | ICD-10-CM | POA: Diagnosis present

## 2019-04-05 DIAGNOSIS — K219 Gastro-esophageal reflux disease without esophagitis: Secondary | ICD-10-CM | POA: Diagnosis present

## 2019-04-05 DIAGNOSIS — Z87442 Personal history of urinary calculi: Secondary | ICD-10-CM | POA: Diagnosis not present

## 2019-04-05 DIAGNOSIS — I10 Essential (primary) hypertension: Secondary | ICD-10-CM | POA: Diagnosis present

## 2019-04-05 DIAGNOSIS — Z8249 Family history of ischemic heart disease and other diseases of the circulatory system: Secondary | ICD-10-CM | POA: Diagnosis not present

## 2019-04-05 LAB — BASIC METABOLIC PANEL
Anion gap: 8 (ref 5–15)
BUN: 17 mg/dL (ref 8–23)
CO2: 26 mmol/L (ref 22–32)
Calcium: 8.3 mg/dL — ABNORMAL LOW (ref 8.9–10.3)
Chloride: 107 mmol/L (ref 98–111)
Creatinine, Ser: 0.93 mg/dL (ref 0.61–1.24)
GFR calc Af Amer: >60 mL/min (ref >60)
GFR calc non Af Amer: >60 mL/min (ref >60)
Glucose, Bld: 119 mg/dL — ABNORMAL HIGH (ref 70–99)
Potassium: 4.1 mmol/L (ref 3.5–5.1)
Sodium: 141 mmol/L (ref 135–145)

## 2019-04-05 LAB — CBC
HCT: 25.2 % — ABNORMAL LOW (ref 39.0–52.0)
HCT: 23.5 % — ABNORMAL LOW (ref 39.0–52.0)
HCT: 23.2 % — ABNORMAL LOW (ref 39.0–52.0)
HCT: 22 % — ABNORMAL LOW (ref 39.0–52.0)
Hemoglobin: 8.4 g/dL — ABNORMAL LOW (ref 13.0–17.0)
Hemoglobin: 7.6 g/dL — ABNORMAL LOW (ref 13.0–17.0)
Hemoglobin: 7.8 g/dL — ABNORMAL LOW (ref 13.0–17.0)
Hemoglobin: 7.4 g/dL — ABNORMAL LOW (ref 13.0–17.0)
MCH: 29.8 pg (ref 26.0–34.0)
MCH: 29.5 pg (ref 26.0–34.0)
MCH: 30.2 pg (ref 26.0–34.0)
MCH: 30.1 pg (ref 26.0–34.0)
MCHC: 33.3 g/dL (ref 30.0–36.0)
MCHC: 32.3 g/dL (ref 30.0–36.0)
MCHC: 33.6 g/dL (ref 30.0–36.0)
MCHC: 33.6 g/dL (ref 30.0–36.0)
MCV: 89.4 fL (ref 80.0–100.0)
MCV: 91.1 fL (ref 80.0–100.0)
MCV: 89.9 fL (ref 80.0–100.0)
MCV: 89.4 fL (ref 80.0–100.0)
Platelets: 233 10*3/uL (ref 150–400)
Platelets: 229 10*3/uL (ref 150–400)
Platelets: 245 10*3/uL (ref 150–400)
Platelets: 249 10*3/uL (ref 150–400)
RBC: 2.82 MIL/uL — ABNORMAL LOW (ref 4.22–5.81)
RBC: 2.58 MIL/uL — ABNORMAL LOW (ref 4.22–5.81)
RBC: 2.58 MIL/uL — ABNORMAL LOW (ref 4.22–5.81)
RBC: 2.46 MIL/uL — ABNORMAL LOW (ref 4.22–5.81)
RDW: 13.1 % (ref 11.5–15.5)
RDW: 13.2 % (ref 11.5–15.5)
RDW: 13.2 % (ref 11.5–15.5)
RDW: 13.2 % (ref 11.5–15.5)
WBC: 9.7 10*3/uL (ref 4.0–10.5)
WBC: 10.3 10*3/uL (ref 4.0–10.5)
WBC: 11.3 10*3/uL — ABNORMAL HIGH (ref 4.0–10.5)
WBC: 10.2 10*3/uL (ref 4.0–10.5)
nRBC: 0 % (ref 0.0–0.2)
nRBC: 0 % (ref 0.0–0.2)
nRBC: 0 % (ref 0.0–0.2)
nRBC: 0 % (ref 0.0–0.2)

## 2019-04-05 LAB — URINALYSIS, COMPLETE (UACMP) WITH MICROSCOPIC
Bilirubin Urine: NEGATIVE
Glucose, UA: NEGATIVE mg/dL
Hgb urine dipstick: NEGATIVE
Ketones, ur: NEGATIVE mg/dL
Leukocytes,Ua: NEGATIVE
Nitrite: NEGATIVE
Protein, ur: NEGATIVE mg/dL
Specific Gravity, Urine: 1.041 — ABNORMAL HIGH (ref 1.005–1.030)
pH: 5 (ref 5.0–8.0)

## 2019-04-05 LAB — CK: Total CK: 631 U/L — ABNORMAL HIGH (ref 49–397)

## 2019-04-05 MED ORDER — IOHEXOL 300 MG/ML  SOLN
100.0000 mL | Freq: Once | INTRAMUSCULAR | Status: AC | PRN
  Administered 2019-04-05: 100 mL via INTRAVENOUS

## 2019-04-05 NOTE — Progress Notes (Signed)
04/04/2019 at 2:30am Paged by RN regarding patient having worsening left leg pain. Patient assessed at bedside. VSS. Hematoma on the left leg has significant expanded (see picture below). Increased tenderness to palpation and firmness surrounding the hematoma. Patient has decreased ROM of the left leg. Distal pulses palpable. Capillary refill <2 seconds and patient is able to wiggle his toes. Ordered CBC STAT and CT scan to evaluate for expansion of hematoma. Will transfuse as needed and get MRI to evaluate for any structural abnormalities pending CT results. Patient given pain medication. Will continue to monitor.   ON ADMISSION:    NOW:     - f/u CBC - f/u CT Left hip

## 2019-04-05 NOTE — Progress Notes (Signed)
Subjective:  Saw patient this morning during rounds. Patient has had increased pain and swelling of the left lateral thigh as well expanded bruising since yesterday. These symptoms began manifesting overnight following PT session in which the patient attempted to place all his weight on the leg. Patient also notes that he has been forced to sleep on that side due to numerous cords he is connected to. Patient denies paresthesias  numbness/tingling in lower extremities as well as any lightheadedness upon standing. Advised patient to continue icing area as well as resting his leg as continued movement and weight bearing could lead to further worsening of wound.  Patient additionally endorsed new onset dark colored urine. He stated that he has not been drinking that much water but would try to drink more.    Objective:  Vital signs in last 24 hours: Vitals:   04/04/19 2054 04/04/19 2331 04/05/19 0240 04/05/19 0805  BP: (!) 147/77 105/77 (!) 145/93 (!) 145/93  Pulse: 98 91 99 96  Resp: 18 16  16   Temp: 99.2 F (37.3 C) 98.4 F (36.9 C)  99.2 F (37.3 C)  TempSrc: Oral Oral  Oral  SpO2: 99% 100% 100% 100%   Weight change:   Intake/Output Summary (Last 24 hours) at 04/05/2019 0944 Last data filed at 04/05/2019 0220 Gross per 24 hour  Intake 720 ml  Output 550 ml  Net 170 ml     CBC Latest Ref Rng & Units 04/05/2019 04/04/2019 04/03/2019  WBC 4.0 - 10.5 K/uL 9.7 11.0(H) 14.6(H)  Hemoglobin 13.0 - 17.0 g/dL 8.4(L) 10.2(L) 11.9(L)  Hematocrit 39.0 - 52.0 % 25.2(L) 30.5(L) 36.3(L)  Platelets 150 - 400 K/uL 233 275 335   BMP Latest Ref Rng & Units 04/05/2019 04/03/2019  Glucose 70 - 99 mg/dL 119(H) 109(H)  BUN 8 - 23 mg/dL 17 19  Creatinine 0.61 - 1.24 mg/dL 0.93 1.08  Sodium 135 - 145 mmol/L 141 139  Potassium 3.5 - 5.1 mmol/L 4.1 4.2  Chloride 98 - 111 mmol/L 107 104  CO2 22 - 32 mmol/L 26 24  Calcium 8.9 - 10.3 mg/dL 8.3(L) 9.0      Physical Exam  Constitutional: He is  oriented to person, place, and time. He appears well-developed and well-nourished.  Eyes: Pupils are equal, round, and reactive to light. Conjunctivae and EOM are normal.  Neck: Normal range of motion. Neck supple.  Cardiovascular: Normal rate, regular rhythm, S1 normal and S2 normal. Exam reveals no gallop and no friction rub.  No murmur heard. Pulmonary/Chest: Effort normal and breath sounds normal. No accessory muscle usage. No respiratory distress.  Abdominal: Soft. Bowel sounds are normal. There is no abdominal tenderness. There is no rebound.  Musculoskeletal:     Left elbow: He exhibits swelling. Tenderness found. Medial epicondyle tenderness noted.     Left hip: He exhibits decreased range of motion, decreased strength, tenderness and swelling.     Comments: -Patient has increased bruising/swelling/tenderness/firmness of hematoma over left lateral thigh and hip -Patient has a 1.5 cm blister like bubble overlying the hematoma without bleeding/discharge/crusting, covered by bandage  Neurological: He is alert and oriented to person, place, and time.  Skin: Skin is warm and dry.    Patient Summary Mr. Nickson 68 y.o.malew/ PMHx of hypertension that presented on 10/29 due to  syncopal episode resulting in a fall with left hip hematoma and additional subsequent presyncopal episodes.  Patient noted increasing pain/bruising/firmness of hematoma and decreased ROM of left hip following PT session yesterday.  His Hgb dropped from 10.2 to 8.4 as well. CT scan revealed unchanged organized hematoma but increased amount of nonconsolidated hematoma/edema throughout the subcutaneous tissue in the lateral left thigh concerning for rebleeding  Assessment/Plan:  Principal Problem:   Near syncope Active Problems:   Hematoma of left hip   Syncope   Orthostatic hypotension  Syncope -patient's orthostatic vital signs from yesterday are improved -patient denies lightheadedness upon raising from lying  to sitting or sitting to standing Plan:  -continue fluid resuscitation   Hematoma of Left Hip Plan -continue to trend CBC q6 hours -transfuse patient if Hgb drops below 7 -continue to ice area and encourage patient to maintain bedrest  Dark Urine -patient has normal creatinine of .93 which is decreased from when he presented (1.08) -patient does not appear to have AKI at this time  Plan: -obtain urinalysis -obtain CK -continue to monitor Creatinine levels for possible kidney injury   LOS: 0 days   Hilario Quarry, Medical Student 04/05/2019, 9:44 AM

## 2019-04-05 NOTE — Care Management Obs Status (Signed)
[facility] NOTIFICATION   Patient Details  Name: Daniel Mcdowell MRN: [id number] Date of Birth: 1950-08-15   Medicare Observation Status Notification Given:  Yes    Carles Collet, RN 04/05/2019, 8:37 AM

## 2019-04-05 NOTE — Progress Notes (Signed)
Patient left thigh around with increased pain.  RN assessed site and noticed that hematoma had increased in size since shift assessment.  Patient also states that feels like it is about to burst  RN paged provider on call  Provider assessed patient and new orders received   RN will continue to monitor patient

## 2019-04-06 LAB — BASIC METABOLIC PANEL
Anion gap: 6 (ref 5–15)
BUN: 16 mg/dL (ref 8–23)
CO2: 27 mmol/L (ref 22–32)
Calcium: 8.3 mg/dL — ABNORMAL LOW (ref 8.9–10.3)
Chloride: 106 mmol/L (ref 98–111)
Creatinine, Ser: 0.96 mg/dL (ref 0.61–1.24)
GFR calc Af Amer: >60 mL/min (ref >60)
GFR calc non Af Amer: >60 mL/min (ref >60)
Glucose, Bld: 109 mg/dL — ABNORMAL HIGH (ref 70–99)
Potassium: 3.9 mmol/L (ref 3.5–5.1)
Sodium: 139 mmol/L (ref 135–145)

## 2019-04-06 LAB — CBC
HCT: 21.8 % — ABNORMAL LOW (ref 39.0–52.0)
Hemoglobin: 7.1 g/dL — ABNORMAL LOW (ref 13.0–17.0)
MCH: 29.7 pg (ref 26.0–34.0)
MCHC: 32.6 g/dL (ref 30.0–36.0)
MCV: 91.2 fL (ref 80.0–100.0)
Platelets: 231 10*3/uL (ref 150–400)
RBC: 2.39 MIL/uL — ABNORMAL LOW (ref 4.22–5.81)
RDW: 13 % (ref 11.5–15.5)
WBC: 9.4 10*3/uL (ref 4.0–10.5)
nRBC: 0 % (ref 0.0–0.2)

## 2019-04-06 LAB — HEMOGLOBIN AND HEMATOCRIT, BLOOD
HCT: 25.9 % — ABNORMAL LOW (ref 39.0–52.0)
Hemoglobin: 8.4 g/dL — ABNORMAL LOW (ref 13.0–17.0)

## 2019-04-06 LAB — ABO/RH: ABO/RH(D): O POS

## 2019-04-06 LAB — PREPARE RBC (CROSSMATCH)

## 2019-04-06 MED ORDER — SODIUM CHLORIDE 0.9% IV SOLUTION
Freq: Once | INTRAVENOUS | Status: AC
  Administered 2019-04-06: 14:00:00 via INTRAVENOUS

## 2019-04-06 NOTE — Progress Notes (Signed)
   Subjective: Patient got a good night's sleep and feels much better compared to yesterday. Pain and tightness in his leg has improved. Able to move without difficulty. No numbness/tingling. He has ambulated to bathroom with minimal assistance without difficulty.   Objective:  Vital signs in last 24 hours: Vitals:   04/05/19 2000 04/05/19 2333 04/06/19 0410 04/06/19 0840  BP: 128/78 139/73 130/76 (!) 147/75  Pulse: 98 (!) 101 (!) 104 97  Resp: 18 18 18 20   Temp: 99 F (37.2 C) 98.6 F (37 C) 99.1 F (37.3 C) 99.2 F (37.3 C)  TempSrc: Oral Oral Oral Oral  SpO2: 100% 98%  99%   General: awake, alert, lying in bed NAD CV: RRR; no m/r/g PUlm: normal work of breathing; lungs CTAB MSK: left elbow with extensive ecchymosis along medial aspect extending across down into wrist but without significant tenderness of pain with ROM. Left hip with good active ROM. Still with significant bruising and large hematoma but is less tender and firm than yesterday   Assessment/Plan:  Principal Problem:   Near syncope Active Problems:   Hematoma of left hip   Syncope   Orthostatic hypotension  Mr. Filip y.o.malew/ PMHx of hypertension that presented on 10/29 due to  syncopal episode resulting ina fall withleft hip hematoma and additional subsequent presyncopal episodes.  Left hip hematoma - improved clinically from yesterday - Hgb 7.1 this morning; will transfuse 1 unit pRBCs and check post-transfusion H&H; if improves appropriately, can be discharged later today.   Orthostatic hypotension with syncope - resolved s/p fluid resuscitation - orthostatic VS negative this morning   Dispo: Anticipated discharge in approximately 0-1 day(s).   Modena Nunnery D, DO 04/06/2019, 10:28 AM Pager: [phone]

## 2019-04-06 NOTE — Progress Notes (Signed)
Patient has not ordered breakfast; offered to order his breakfast for him; he declined and says he will eat at lunch time today; I offered to get him something to drink; he said no, "water is all I'll take". Patient has not had any caloric intake since yesterday evening; encouraged nourishment. Will obtain ortho static vital signs per order.

## 2019-04-06 NOTE — Progress Notes (Signed)
Denies pain to his left leg.  Said he feels so much better than he did yesterday.  Hip remains hard and swollen but has improved since previous day.  Pt states he thinks it is much better.  Hgb at 2014 on 10/31 was 7.4, down from 7.8.  No blood in urine.  Urine color improved.  Elevated leg and said he feels much better since he is not doing strenuous exercises with his leg and not walking long distances.  Will continue to monitor his labs and leg.  No signs of distress.

## 2019-04-07 DIAGNOSIS — W01198A Fall on same level from slipping, tripping and stumbling with subsequent striking against other object, initial encounter: Secondary | ICD-10-CM

## 2019-04-07 DIAGNOSIS — I951 Orthostatic hypotension: Principal | ICD-10-CM

## 2019-04-07 DIAGNOSIS — Z9889 Other specified postprocedural states: Secondary | ICD-10-CM

## 2019-04-07 DIAGNOSIS — D62 Acute posthemorrhagic anemia: Secondary | ICD-10-CM

## 2019-04-07 DIAGNOSIS — S7002XA Contusion of left hip, initial encounter: Secondary | ICD-10-CM

## 2019-04-07 LAB — BPAM RBC
Blood Product Expiration Date: 202012052359
ISSUE DATE / TIME: 202011011358
Unit Type and Rh: 5100

## 2019-04-07 LAB — CBC
HCT: 25 % — ABNORMAL LOW (ref 39.0–52.0)
Hemoglobin: 8.2 g/dL — ABNORMAL LOW (ref 13.0–17.0)
MCH: 29.6 pg (ref 26.0–34.0)
MCHC: 32.8 g/dL (ref 30.0–36.0)
MCV: 90.3 fL (ref 80.0–100.0)
Platelets: 252 10*3/uL (ref 150–400)
RBC: 2.77 MIL/uL — ABNORMAL LOW (ref 4.22–5.81)
RDW: 13.2 % (ref 11.5–15.5)
WBC: 7.7 10*3/uL (ref 4.0–10.5)
nRBC: 0 % (ref 0.0–0.2)

## 2019-04-07 LAB — TYPE AND SCREEN
ABO/RH(D): O POS
Antibody Screen: NEGATIVE
Unit division: 0

## 2019-04-07 NOTE — Care Management Important Message (Signed)
Important Message  Patient Details  Name: Daniel Mcdowell MRN: [id number] Date of Birth: [birth year]   Medicare Important Message Given:  Yes     [name] 04/07/2019, 3:23 PM

## 2019-04-07 NOTE — Progress Notes (Signed)
Subjective:  Saw patient this morning during rounds. Patient doing well this morning. He reports pain is well controlled without medication/range of motion is normal/area is softer to the touch. He notes he is sleeping well and is able to ambulate without assistance. There is also no associated lightheadedness upon going from sitting to standing. Patient has no history of stroke/ACS/chest pain for which he takes aspirin; it is purely preventative. Patient states he is followed by Dr. Darron Doom and will be seeing her for a follow up visit in the next 2 weeks.  Objective:  Vital signs in last 24 hours: Vitals:   04/06/19 2300 04/06/19 2315 04/07/19 0328 04/07/19 0823  BP: (!) 172/93 (!) 156/70 (!) 148/70 (!) 142/80  Pulse: 99  98 88  Resp: 18  20 18   Temp: 99 F (37.2 C)  98.8 F (37.1 C) 98.6 F (37 C)  TempSrc: Oral  Oral Oral  SpO2:   98% 99%   Weight change:   Intake/Output Summary (Last 24 hours) at 04/07/2019 0956 Last data filed at 04/06/2019 1715 Gross per 24 hour  Intake 378 ml  Output -  Net 378 ml     CBC Latest Ref Rng & Units 04/07/2019 04/06/2019 04/06/2019  WBC 4.0 - 10.5 K/uL 7.7 - 9.4  Hemoglobin 13.0 - 17.0 g/dL 8.2(L) 8.4(L) 7.1(L)  Hematocrit 39.0 - 52.0 % 25.0(L) 25.9(L) 21.8(L)  Platelets 150 - 400 K/uL 252 - 231   BMP Latest Ref Rng & Units 04/06/2019 04/05/2019 04/03/2019  Glucose 70 - 99 mg/dL 109(H) 119(H) 109(H)  BUN 8 - 23 mg/dL 16 17 19   Creatinine 0.61 - 1.24 mg/dL 0.96 0.93 1.08  Sodium 135 - 145 mmol/L 139 141 139  Potassium 3.5 - 5.1 mmol/L 3.9 4.1 4.2  Chloride 98 - 111 mmol/L 106 107 104  CO2 22 - 32 mmol/L 27 26 24   Calcium 8.9 - 10.3 mg/dL 8.3(L) 8.3(L) 9.0     Physical Exam  Constitutional: He is oriented to person, place, and time. He appears well-developed and well-nourished.  Eyes: Pupils are equal, round, and reactive to light. Conjunctivae and EOM are normal.  Neck: Normal range of motion. Neck supple.  Cardiovascular: Normal  rate, regular rhythm, S1 normal and S2 normal. Exam reveals no gallop and no friction rub.  No murmur heard. Pulmonary/Chest: Effort normal and breath sounds normal. No accessory muscle usage. No respiratory distress.  Abdominal: Soft. Bowel sounds are normal. There is no abdominal tenderness. There is no rebound.  Musculoskeletal:     Left elbow: He exhibits swelling. Tenderness found. Medial epicondyle tenderness noted.     Left hip: He exhibits tenderness and swelling.    Comments: -Patient has increased ecchymosis/swelling/tenderness/firmness of hematoma over left lateral thigh and hip -Patient has a 1.5 cm blister like bubble overlying the hematoma without bleeding/discharge/crusting, covered by bandage  Neurological: He is alert and oriented to person, place, and time.  Skin: Skin is warm and dry.   Patient Summary Mr. Poffenberger y.o.malew/ PMHx of hypertensionthatpresented on 10/29 due tosyncopal episode resulting ina fall withleft hip hematoma and additional subsequent presyncopal episodes.  Patient's Hgb improved to 8.4 as of yesterday evening and remained stable through this morning. Patient to be discharged later today.  Assessment/Plan:  Principal Problem:   Near syncope Active Problems:   Hematoma of left hip   Syncope   Orthostatic hypotension  Left Hip Hematoma -improved clinically since yesterday with full ROM -pain well controlled with minimal medication required Plan: -discharge  patient -recommended patient stop taking aspirin until he can follow up with his PCP in the next 2 weeks  Orthostatic Hypotension with syncope -resolved with fluid resuscitation -orthostatic vitals have been negative since yesterday Plan: -recommended patient stop taking lisinopril until he can follow up with his PCP in the next 2 weeks   LOS: 2 days   Hilario Quarry, Medical Student 04/07/2019, 9:56 AM

## 2019-04-07 NOTE — Discharge Summary (Signed)
Name: Daniel Mcdowell MRN: [id number] DOB: March 25, 1951 68 y.o. PCP: Dois Davenport, MD  Date of Admission: 04/03/2019  2:28 PM Date of Discharge: 04/07/2019 Attending Physician: No att. providers found  Discharge Diagnosis: 1. Syncope 2. Left Hip Hematoma  Discharge Medications: Allergies as of 04/07/2019   No Known Allergies     Medication List    STOP taking these medications   aspirin EC 81 MG tablet   lisinopril 40 MG tablet Commonly known as: ZESTRIL     TAKE these medications   multivitamin with minerals Tabs tablet Take 1 tablet by mouth daily.   omeprazole 20 MG capsule Commonly known as: PRILOSEC Take 20 mg by mouth daily.       Disposition and follow-up:   Mr.Daniel Mcdowell was discharged from [facility] in Stable condition.  At the hospital follow up visit please address:  Hematoma involving the left hip and thigh.  He experienced significant bleeding due to this requiring blood transfusion.  Hemoglobin was stable at discharge.  Syncope.  No significant cardiac findings.  Antihypertensives held on discharge.  Labs / imaging needed at time of follow-up: CBC   Follow-up Appointments: Follow-up Information    Dois Davenport, MD Follow up.   Specialty: Family Medicine Contact information: [address] [place] [address] [phone]           Hospital Course by problem list: [name]/ PMHx of hypertension that presented on 10/29 due to syncopal episode resulting ina fall withleft hip hematoma and additional subsequent presyncopal episodes. Patient was fluid resuscitated throughout admission. During hospital stay, patient's hemoglobin continued to drop eventually requiring transfusion on 11/1. Pain controlled, hemoglobin stabilized, and patient able to ambulate prior to discharge on 11/2.   1. Syncope  On 10/29 patient presented to [facility] ED due to a reported mechanical fall. Patient  reported a history that was more consistent with syncopal event stating he felt clammy and diaphoretic after using the bathroom and then slipped and fell. Patient reported decreased po intake for the 24 hours prior. EKG and Echo were normal. During initial orthostatic measurements the following morning, patient experienced an additional syncopal event. Fluid resuscitation was given. Repeat orthostatics performed on 10/31 were normal. Patient denied any further lightheadedness upon rising from sitting to standing prior to discharge.   2. Left Hip Hematoma  Following patient's fall, plain films and CT were negative for fractures/dislocations of the lower left extremity however they did reveal large superficial soft tissue hematoma overlying the greater trochanter. Hgb at 12.3 and actively decreasing was concerning for active bleeding patient not anticoagulated but was taking aspirin. Aspirin held upon admission. Upon physical exam, the area was firm/darkened/tender to palpation but did not limit patient's range of motion. The following day, patient was ambulating with PT and was reportedly doing very well walking over 200 feet and back however following the session patient's hemoglobin dropped further to 7.4 and 7.1. CT scan revealed unchanged organized hematoma but increased amount of nonconsolidated hematoma/edema throughout the subcutaneous tissue in the lateral left thigh concerning for rebleeding. Patient received a blood transfusion on 11/1 and later Hgb began to increase back to 8.4 the following day.   Patient discharged with instruction to continue resting and icing the area. Pain is controlled and patient has full ROM prior to discharge. As aspirin is for secondary prophylaxis, patient instructed to stop taking aspirin until he can follow up with PCP in the next 2  weeks.   3. Hematoma of Left Medial Epicondyle  Patient additionally endorsed moderate pain over the left medial epicondyle with  significant bruising noted on exam. Patient states pain is controlled and he has full ROM. Aspirin held as noted above.  Discharge Vitals:   BP (!) 175/100   Pulse 87   Temp 98.9 F (37.2 C) (Oral)   Resp 18   SpO2 100%   Pertinent Labs, Studies, and Procedures:  10/29 left CT hip: Large 11.4 x 4.5 x 18.5 cm superficial soft tissue hematoma overlying the greater trochanter and proximal subtrochanteric femur.  Discharge Instructions: Discharge Instructions    Diet - low sodium heart healthy   Complete by: As directed    Increase activity slowly   Complete by: As directed       Signed: Mitzi Hansen, MD 04/08/2019, 10:22 AM   Pager: [phone]

## 2019-04-07 NOTE — TOC Transition Note (Signed)
Transition of Care [facility]) - CM/SW Discharge Note   Patient Details  Name: Daniel Mcdowell MRN: [id number] Date of Birth: Aug 29, 1950  Transition of Care [facility]) CM/SW Contact:  Pollie Friar, RN Phone Number: 04/07/2019, 10:56 AM   Clinical Narrative:    Pt discharging home with self care. No f/u per PT and no DME needs. Pt has hospital f/u and transportation home.   Final next level of care: Home/Self Care Barriers to Discharge: No Barriers Identified   Patient Goals and CMS Choice        Discharge Placement                       Discharge Plan and Services                                     Social Determinants of Health (SDOH) Interventions     Readmission Risk Interventions No flowsheet data found.

## 2019-04-07 NOTE — Plan of Care (Signed)

## 2019-07-27 ENCOUNTER — Ambulatory Visit: Payer: Medicare Other | Attending: Internal Medicine

## 2019-07-27 DIAGNOSIS — Z23 Encounter for immunization: Secondary | ICD-10-CM

## 2019-07-27 NOTE — Progress Notes (Signed)
   Covid-19 Vaccination Clinic  Name:  Tawfiq Favila    MRN: [id number] DOB: 1951/03/18  07/27/2019  Mr. Trettin was observed post Covid-19 immunization for 15 minutes without incidence. He was provided with Vaccine Information Sheet and instruction to access the V-Safe system.   Mr. Kiester was instructed to call 911 with any severe reactions post vaccine: Marland Kitchen Difficulty breathing  . Swelling of your face and throat  . A fast heartbeat  . A bad rash all over your body  . Dizziness and weakness    Immunizations Administered    Name Date Dose VIS Date Route   Pfizer COVID-19 Vaccine 07/27/2019  2:03 PM 0.3 mL 05/16/2019 Intramuscular   Manufacturer: [employer], [employer]   Lot: [id number]   NDC: [address]-8

## 2019-08-20 ENCOUNTER — Ambulatory Visit: Payer: Medicare Other | Attending: Internal Medicine

## 2019-08-20 DIAGNOSIS — Z23 Encounter for immunization: Secondary | ICD-10-CM

## 2019-08-20 NOTE — Progress Notes (Signed)
   Covid-19 Vaccination Clinic  Name:  Daniel Mcdowell    MRN: [id number] DOB: [birth year]  08/20/2019  [name] was observed post Covid-19 immunization for 15 minutes without incident. He was provided with Vaccine Information Sheet and instruction to access the V-Safe system.   [name] was instructed to call 911 with any severe reactions post vaccine: Marland Kitchen Difficulty breathing  . Swelling of face and throat  . A fast heartbeat  . A bad rash all over body  . Dizziness and weakness   Immunizations Administered    Name Date Dose VIS Date Route   Pfizer COVID-19 Vaccine 08/20/2019  2:01 PM 0.3 mL 05/16/2019 Intramuscular   Manufacturer: [employer], [employer]   Lot: [id number]   NDC: [address]-2

## 2020-09-27 IMAGING — CT CT HIP*L* W/CM
2 of 3 series · 17 of 46 positions shown, 19 images · IV contrast (APPLIED)
Comparison: CT hip 04/03/2019

CONTRAST:  100mL OMNIPAQUE IOHEXOL 300 MG/ML  SOLN

CLINICAL DATA: Worsening left thigh hematoma

EXAM:
CT OF THE LOWER LEFT EXTREMITY WITH CONTRAST
TECHNIQUE: Multidetector CT imaging of the lower left extremity was performed
according to the standard protocol following intravenous contrast
administration.

[Series 5: soft tissue · axial · 0.66mm/px · z∈[+607,+977]mm · 14 of 213 slices shown, 16 images]
[im 14/213  soft-tissue]
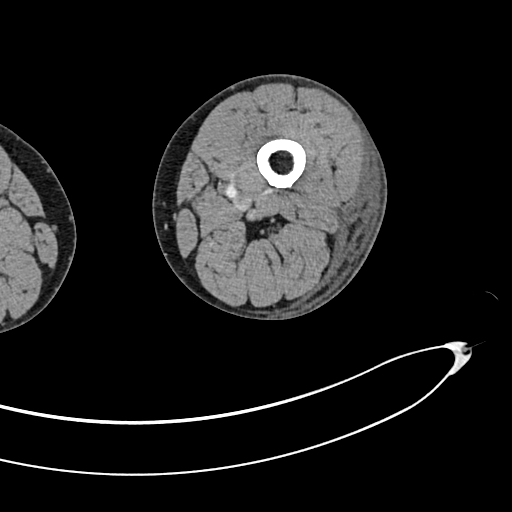
[im 14/213  bone]
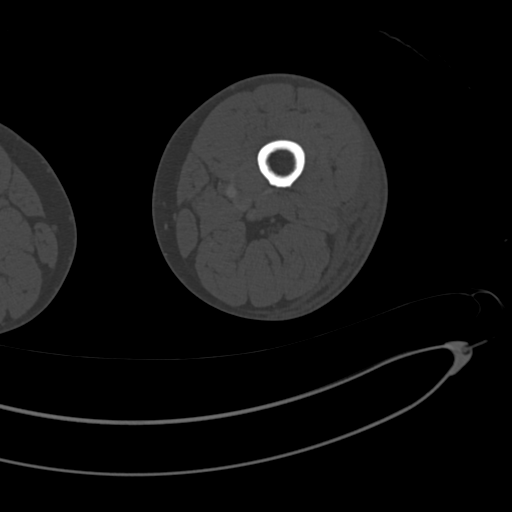
[im 28/213  soft-tissue]
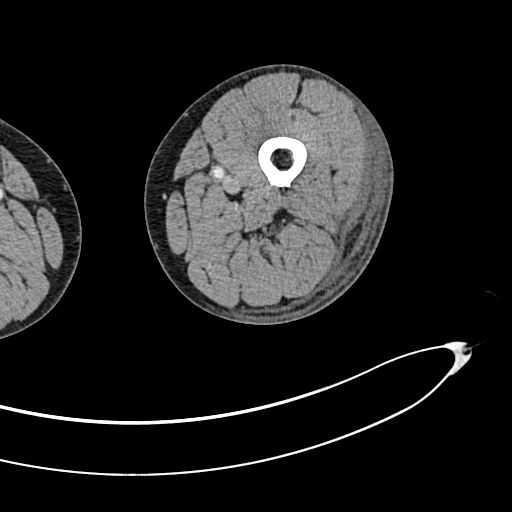
[im 42/213  soft-tissue]
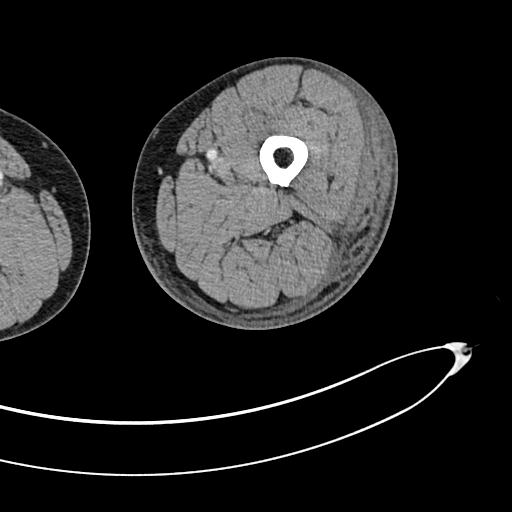
[im 55/213  soft-tissue]
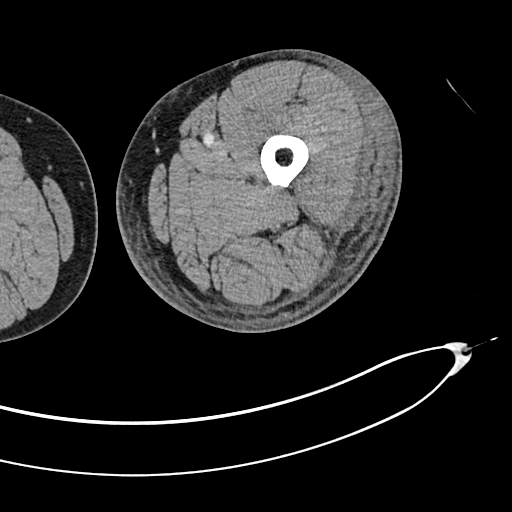
[im 69/213  soft-tissue]
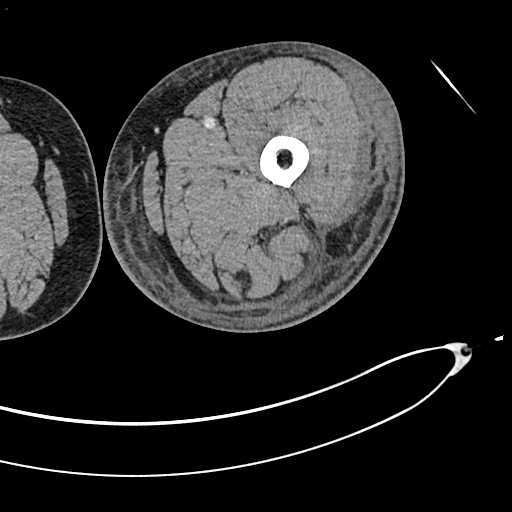
[im 83/213  soft-tissue]
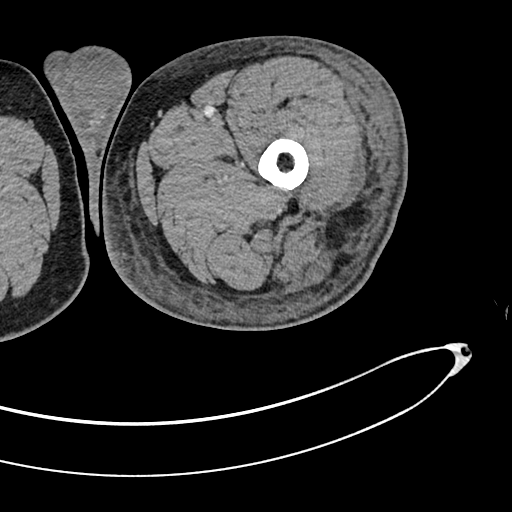
[im 96/213  soft-tissue]
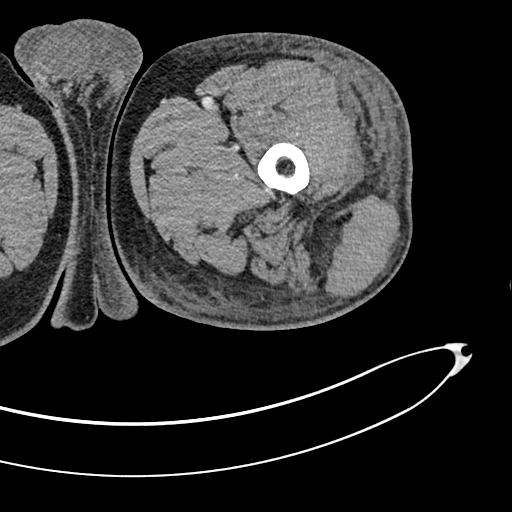
[im 117/213  soft-tissue]
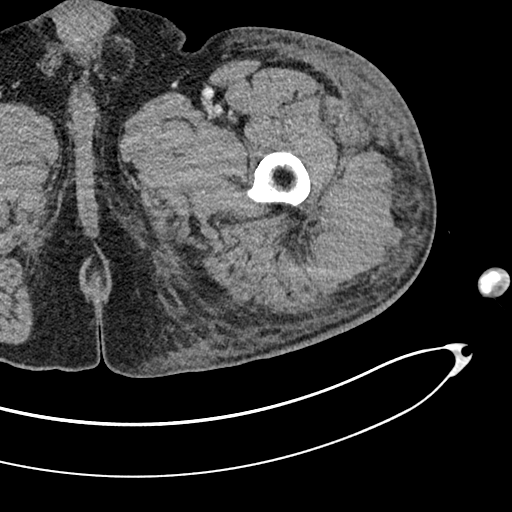
[im 130/213  soft-tissue]
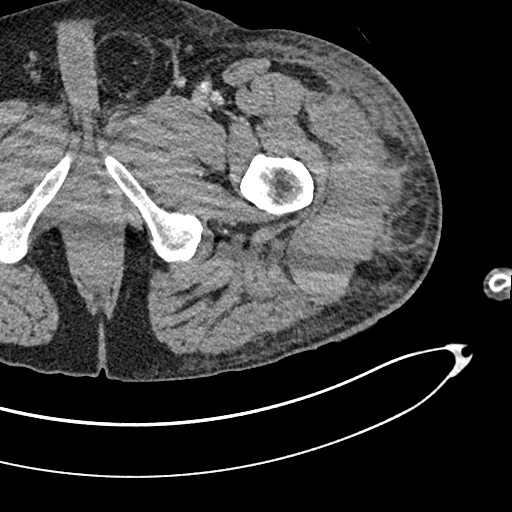
[im 130/213  bone]
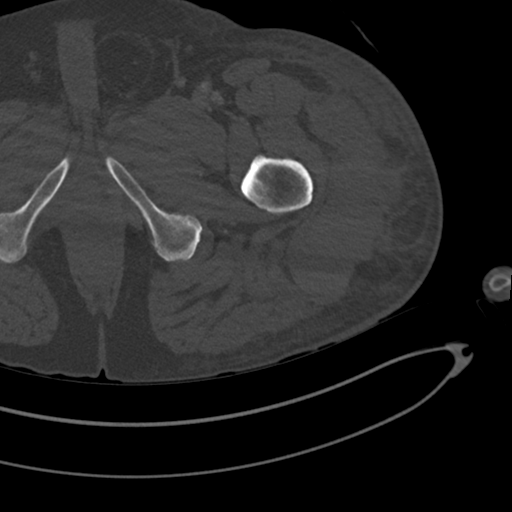
[im 144/213  soft-tissue]
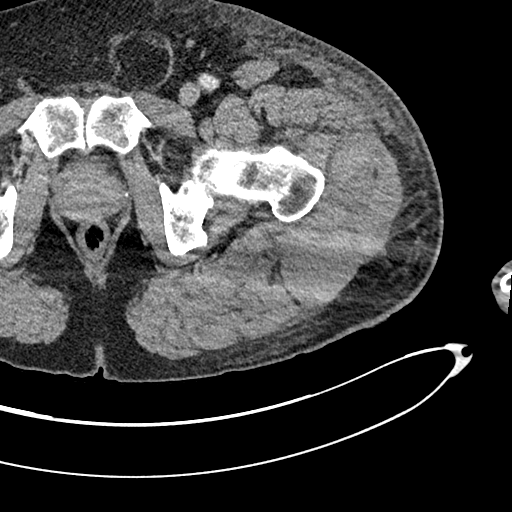
[im 158/213  soft-tissue]
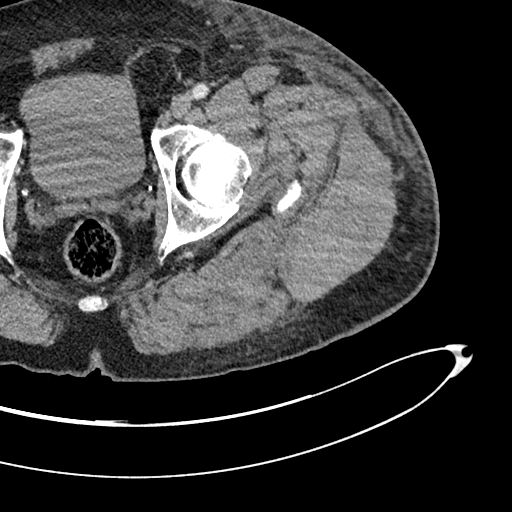
[im 171/213  soft-tissue]
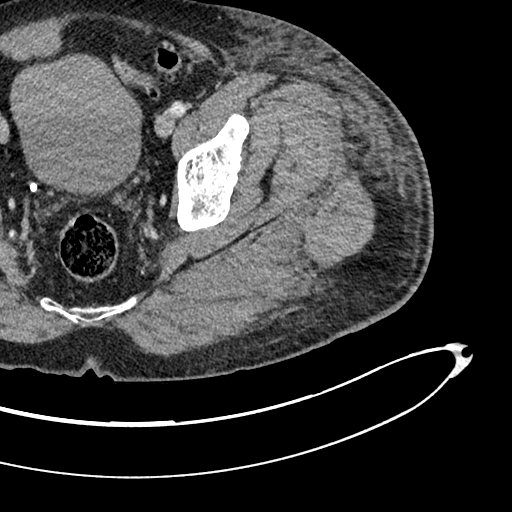
[im 185/213  soft-tissue]
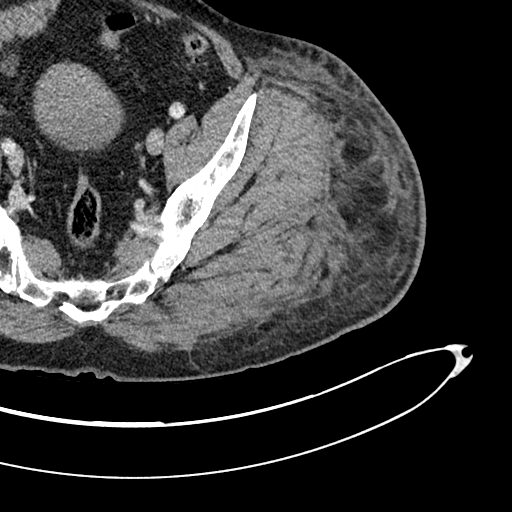
[im 199/213  soft-tissue]
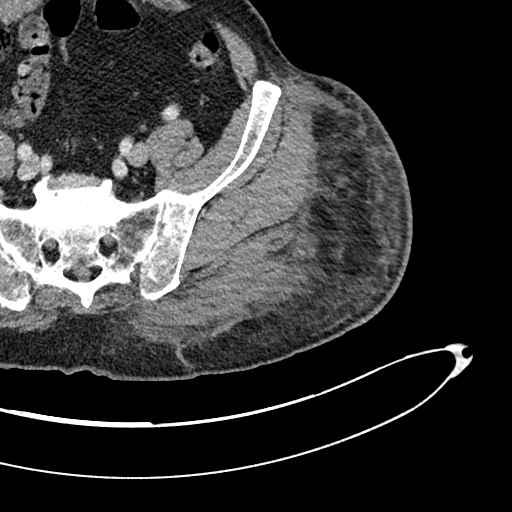

[Series 6: cor soft · coronal · 0.78mm/px · 3 of 167 slices shown]
[im 56/167  soft-tissue]
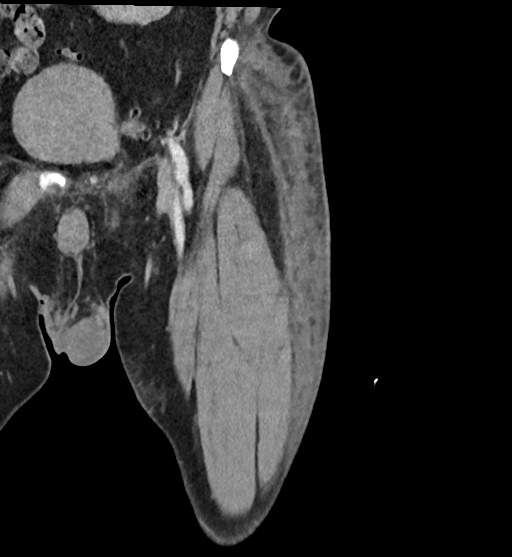
[im 74/167  soft-tissue]
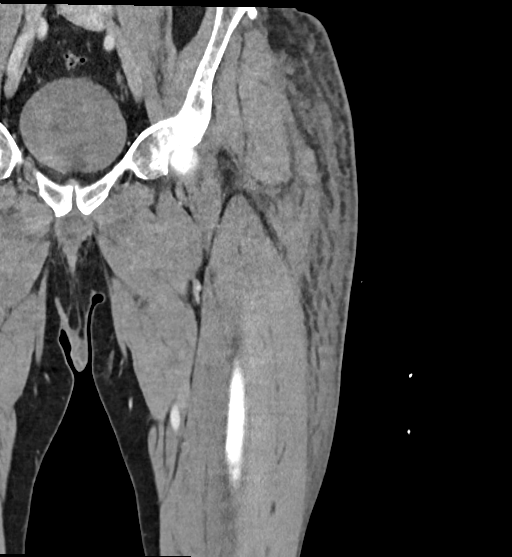
[im 93/167  soft-tissue]
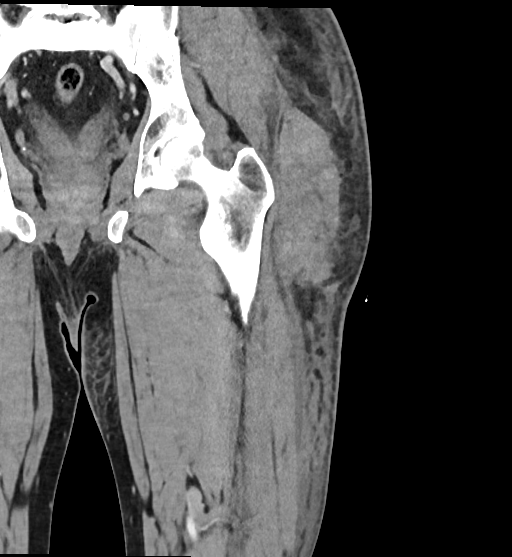

[17 of 46 positions shown; findings below may reference images not displayed]

FINDINGS: Hematoma within the superficial soft tissues of the left thigh
measures 18.3 x 11.8 x 5.0 cm, unchanged. More superficially, there
is unorganized hematoma/edema that now extends more superiorly and
inferiorly than on the prior study.
IMPRESSION: 1. Unchanged size organized hematoma superficial to the left greater
trochanter.
2. Increased amount of nonconsolidated hematoma/edema throughout the
subcutaneous lateral left thigh.

## 2022-10-19 ENCOUNTER — Encounter (HOSPITAL_BASED_OUTPATIENT_CLINIC_OR_DEPARTMENT_OTHER): Payer: Self-pay

## 2022-10-19 ENCOUNTER — Other Ambulatory Visit: Payer: Self-pay

## 2022-10-19 ENCOUNTER — Emergency Department (HOSPITAL_BASED_OUTPATIENT_CLINIC_OR_DEPARTMENT_OTHER): Payer: Medicare Other

## 2022-10-19 ENCOUNTER — Emergency Department (HOSPITAL_BASED_OUTPATIENT_CLINIC_OR_DEPARTMENT_OTHER)
Admission: EM | Admit: 2022-10-19 | Discharge: 2022-10-20 | Disposition: A | Discharge status: Home / Self Care | Payer: Medicare Other | Attending: Emergency Medicine | Admitting: Emergency Medicine

## 2022-10-19 DIAGNOSIS — K4090 Unilateral inguinal hernia, without obstruction or gangrene, not specified as recurrent: Secondary | ICD-10-CM | POA: Insufficient documentation

## 2022-10-19 DIAGNOSIS — K625 Hemorrhage of anus and rectum: Secondary | ICD-10-CM | POA: Diagnosis present

## 2022-10-19 DIAGNOSIS — K429 Umbilical hernia without obstruction or gangrene: Secondary | ICD-10-CM | POA: Diagnosis not present

## 2022-10-19 LAB — COMPREHENSIVE METABOLIC PANEL
ALT: 37 U/L (ref 0–44)
AST: 23 U/L (ref 15–41)
Albumin: 4.7 g/dL (ref 3.5–5.0)
Alkaline Phosphatase: 65 U/L (ref 38–126)
Anion gap: 9 (ref 5–15)
BUN: 22 mg/dL (ref 8–23)
CO2: 27 mmol/L (ref 22–32)
Calcium: 9.5 mg/dL (ref 8.9–10.3)
Chloride: 107 mmol/L (ref 98–111)
Creatinine, Ser: 0.89 mg/dL (ref 0.61–1.24)
GFR, Estimated: >60 mL/min (ref >60)
Glucose, Bld: 134 mg/dL — ABNORMAL HIGH (ref 70–99)
Potassium: 3.9 mmol/L (ref 3.5–5.1)
Sodium: 143 mmol/L (ref 135–145)
Total Bilirubin: 0.5 mg/dL (ref 0.3–1.2)
Total Protein: 7.1 g/dL (ref 6.5–8.1)

## 2022-10-19 LAB — CBC
HCT: 42.2 % (ref 39.0–52.0)
Hemoglobin: 13.9 g/dL (ref 13.0–17.0)
MCH: 29.8 pg (ref 26.0–34.0)
MCHC: 32.9 g/dL (ref 30.0–36.0)
MCV: 90.4 fL (ref 80.0–100.0)
Platelets: 261 10*3/uL (ref 150–400)
RBC: 4.67 MIL/uL (ref 4.22–5.81)
RDW: 12.6 % (ref 11.5–15.5)
WBC: 6.7 10*3/uL (ref 4.0–10.5)
nRBC: 0 % (ref 0.0–0.2)

## 2022-10-19 LAB — OCCULT BLOOD X 1 CARD TO LAB, STOOL: Fecal Occult Bld: POSITIVE — AB

## 2022-10-19 MED ORDER — IOHEXOL 300 MG/ML  SOLN
100.0000 mL | Freq: Once | INTRAMUSCULAR | Status: AC | PRN
  Administered 2022-10-19: 88 mL via INTRAVENOUS

## 2022-10-19 NOTE — ED Provider Notes (Signed)
[place] EMERGENCY DEPARTMENT AT [facility] Provider Note   CSN: [id number] Arrival date & time: 10/19/22  2301     History  Chief Complaint  Patient presents with   Rectal Bleeding    Daniel Mcdowell is a 72 y.o. male.  Patient with episode of bright red blood per rectum tonight after having a bowel movement.  States he went to have a normal bowel movement about 9 PM when he noticed bright red blood with wiping and red blood in the toilet bowl.  Stool itself is brown but there is red around it.  States he has had hemorrhoids in the past with intermittent bleeding.  Denies any straining or constipation.  Normally goes every day.  States he became concerned because bleeding seemed continuous even after not having a bowel movement.  Stool itself was brown.  Denies any rectal pain, abdominal pain, chest pain, shortness of breath, nausea or vomiting.  Takes aspirin but no other anticoagulation.  States he had a colonoscopy and Cologuard in the past which have been reassuring. States he has a known history of hemorrhoids as well as umbilical hernia.  Hernia today is bulging up but not tender and not bothering him.  He has had bleeding in the past but never to this extent.  Denies feeling dizzy or lightheaded.  Blood pressure is elevated, did not take medications tonight.  The history is provided by the patient.  Rectal Bleeding Associated symptoms: no abdominal pain, no dizziness, no fever and no vomiting        Home Medications Prior to Admission medications   Medication Sig Start Date End Date Taking? Authorizing Provider  Multiple Vitamin (MULTIVITAMIN WITH MINERALS) TABS tablet Take 1 tablet by mouth daily.    [provider]  omeprazole (PRILOSEC) 20 MG capsule Take 20 mg by mouth daily.    [provider]      Allergies    Patient has no known allergies.    Review of Systems   Review of Systems  Constitutional:  Negative for activity change, appetite  change and fever.  HENT:  Negative for congestion and rhinorrhea.   Respiratory:  Negative for chest tightness and shortness of breath.   Cardiovascular:  Negative for chest pain.  Gastrointestinal:  Positive for anal bleeding, blood in stool and hematochezia. Negative for abdominal pain, nausea and vomiting.  Genitourinary:  Negative for dysuria and hematuria.  Musculoskeletal:  Negative for arthralgias and myalgias.  Skin:  Negative for rash.  Neurological:  Negative for dizziness, weakness and headaches.   all other systems are negative except as noted in the HPI and PMH.    Physical Exam Updated Vital Signs BP (!) 184/98   Pulse 65   Temp 98.3 F (36.8 C) (Oral)   Resp 18   Ht 5\' 9"  (1.753 m)   Wt 95.3 kg   SpO2 96%   BMI 31.01 kg/m  Physical Exam Vitals and nursing note reviewed.  Constitutional:      General: He is not in acute distress.    Appearance: He is well-developed.  HENT:     Head: Normocephalic and atraumatic.     Mouth/Throat:     Pharynx: No oropharyngeal exudate.  Eyes:     Conjunctiva/sclera: Conjunctivae normal.     Pupils: Pupils are equal, round, and reactive to light.  Neck:     Comments: No meningismus. Cardiovascular:     Rate and Rhythm: Normal rate and regular rhythm.  Heart sounds: Normal heart sounds. No murmur heard. Pulmonary:     Effort: Pulmonary effort is normal. No respiratory distress.     Breath sounds: Normal breath sounds.  Abdominal:     Palpations: Abdomen is soft.     Tenderness: There is no abdominal tenderness. There is no guarding or rebound.     Hernia: A hernia is present.     Comments: Reducible umbilical hernia, soft Reducible L inguinal hernia  Genitourinary:    Comments: Prolapsed internal hemorrhoids present, easily reducible.  Scattered blood on rectal exam.  No tenderness.  No fissures Musculoskeletal:        General: No tenderness. Normal range of motion.     Cervical back: Normal range of motion and neck  supple.  Skin:    General: Skin is warm.  Neurological:     Mental Status: He is alert and oriented to person, place, and time.     Cranial Nerves: No cranial nerve deficit.     Motor: No abnormal muscle tone.     Coordination: Coordination normal.     Comments: No ataxia on finger to nose bilaterally. No pronator drift. 5/5 strength throughout. CN 2-12 intact.Equal grip strength. Sensation intact.   Psychiatric:        Behavior: Behavior normal.     ED Results / Procedures / Treatments   Labs (all labs ordered are listed, but only abnormal results are displayed) Labs Reviewed  COMPREHENSIVE METABOLIC PANEL - Abnormal; Notable for the following components:      Result Value   Glucose, Bld 134 (*)    All other components within normal limits  OCCULT BLOOD X 1 CARD TO LAB, STOOL - Abnormal; Notable for the following components:   Fecal Occult Bld POSITIVE (*)    All other components within normal limits  CBC    EKG None  Radiology CT ABDOMEN PELVIS W CONTRAST  Result Date: 10/20/2022 CLINICAL DATA:  Abdominal pain, acute, nonlocalized Lower GI bleed EXAM: CT ABDOMEN AND PELVIS WITH CONTRAST TECHNIQUE: Multidetector CT imaging of the abdomen and pelvis was performed using the standard protocol following bolus administration of intravenous contrast. RADIATION DOSE REDUCTION: This exam was performed according to the departmental dose-optimization program which includes automated exposure control, adjustment of the mA and/or kV according to patient size and/or use of iterative reconstruction technique. CONTRAST:  88mL OMNIPAQUE IOHEXOL 300 MG/ML  SOLN COMPARISON:  None Available. FINDINGS: Lower chest: No acute abnormality. Hepatobiliary: No focal liver abnormality. No gallstones, gallbladder wall thickening, or pericholecystic fluid. No biliary dilatation. Pancreas: No focal lesion. Normal pancreatic contour. No surrounding inflammatory changes. No main pancreatic ductal dilatation.  Spleen: Normal in size without focal abnormality. Adrenals/Urinary Tract: No adrenal nodule bilaterally. Bilateral kidneys enhance symmetrically. No hydronephrosis. No hydroureter. 1.6 cm left renal Stone. No right nephrolithiasis. No ureterolithiasis bilaterally. Fluid density lesions within bilateral kidneys likely represent simple renal cysts. Simple renal cysts, in the absence of clinically indicated signs/symptoms, require no independent follow-up. Subcentimeter hypodensities are too small to characterize-no further follow-up indicated. The urinary bladder is unremarkable. On delayed imaging, there is no urothelial wall thickening and there are no filling defects in the opacified portions of the bilateral collecting systems or ureters. Stomach/Bowel: Stomach is within normal limits. No evidence of bowel wall thickening or dilatation. Colonic diverticulosis. Appendix appears normal. Vascular/Lymphatic: No abdominal aorta or iliac aneurysm. Mild atherosclerotic plaque of the aorta and its branches. No abdominal, pelvic, or inguinal lymphadenopathy. Reproductive: Prostate is unremarkable.  Bilateral  small hydroceles. Other: No intraperitoneal free fluid. No intraperitoneal free gas. No organized fluid collection. Musculoskeletal: Moderate volume left inguinal hernia containing a short loop of sigmoid/distal descending colon. Small to moderate volume umbilical hernia containing a short loop of small bowel. No suspicious lytic or blastic osseous lesions. No acute displaced fracture. Multilevel degenerative changes of the spine. IMPRESSION: 1. Nonobstructive 1.6 cm left nephrolithiasis. 2. Moderate volume left inguinal hernia containing a short loop of sigmoid/distal descending colon. Small to moderate volume umbilical hernia containing a short loop of small bowel. No associated findings of small-bowel obstruction. No findings to suggest ischemia. 3. Colonic diverticulosis with no acute diverticulitis. 4. Bilateral  small hydroceles. 5.  Aortic Atherosclerosis (ICD10-I70.0). 6. Unable to evaluate for gastrointestinal hemorrhage on this single phase study. Electronically Signed   By: Tish Frederickson M.D.   On: 10/20/2022 00:22    Procedures Procedures    Medications Ordered in ED Medications - No data to display  ED Course/ Medical Decision Making/ A&P                             Medical Decision Making Amount and/or Complexity of Data Reviewed Labs: ordered. Decision-making details documented in ED Course. Radiology: ordered and independent interpretation performed. Decision-making details documented in ED Course. ECG/medicine tests: ordered and independent interpretation performed. Decision-making details documented in ED Course.  Risk Prescription drug management.   Episode of rectal bleeding after bowel movement.  Likely diverticular versus hemorrhoidal in origin.  Vital stable, no distress, abdomen soft without peritoneal signs.  Reducible umbilical and L inguinal hernia which patient is aware of.  Prolapsed internal hemorrhoids on exam which are easily reducible.  Hemoglobin is stable. Orthostatics are negative.   Patient denies any pain, dizziness, lightheadedness, chest pain or shortness of breath.  CT scan obtained given his rectal bleeding as well as inguinal and umbilical hernias.  CT negative for acute etiology of his bleeding.  Does have left kidney stone, diverticula, inguinal and umbilical hernias.  Suspect likely diverticular or hemorrhoidal in origin.  Found to have umbilical as well as inguinal hernia without evidence of obstruction.  Hernias are reducible on exam.  No evidence of significant GI bleed.  Will refer to general surgery as well as gastroenterology for further evaluation of rectal bleeding and hernias. Return to the ED with worsening pain, dizziness, lightheadedness, chest pain, shortness of breath, nausea, vomiting or other concerns.       Final Clinical  Impression(s) / ED Diagnoses Final diagnoses:  Rectal bleeding    Rx / DC Orders ED Discharge Orders     None         Jesenia Spera, Jeannett Senior, MD 10/20/22 [phone]

## 2022-10-19 NOTE — ED Triage Notes (Signed)
Patient here POV from Home.  Endorses 1 Episode of Rectal Bleeding noted after having a BM at 2100. Bleeding seemed to be somewhat continuous.   No N/V/D. No Straining or Constipation noted. No Pain. More Dark than bright.   NAD Noted during Triage. A&Ox4. GCS 15. Ambulatory.

## 2022-10-20 ENCOUNTER — Emergency Department (HOSPITAL_BASED_OUTPATIENT_CLINIC_OR_DEPARTMENT_OTHER): Payer: Medicare Other

## 2022-10-20 MED ORDER — HYDROCORTISONE (PERIANAL) 2.5 % EX CREA: 1.0000 | TOPICAL_CREAM | Freq: Two times a day (BID) | CUTANEOUS | 0 refills | Status: DC

## 2022-10-20 MED ORDER — CARVEDILOL 12.5 MG PO TABS
25.0000 mg | ORAL_TABLET | Freq: Once | ORAL | Status: AC
  Administered 2022-10-20: 25 mg via ORAL
  Filled 2022-10-20: qty 2

## 2022-10-20 NOTE — ED Notes (Signed)
Reviewed AVS with patient, patient expressed understanding of directions, denies further questions at this time. 

## 2022-10-20 NOTE — Discharge Instructions (Addendum)
As we discussed, we suspect your rectal bleeding is coming from your hemorrhoids.  Follow-up with the gastroenterologist as well as the surgeon for further evaluation of your hernia.  Return to the ED with worsening pain, rectal bleeding, nausea, vomiting, dizziness, chest pain, shortness of breath, not able to reduce your hernias or any other concerns.

## 2022-12-12 ENCOUNTER — Ambulatory Visit: Payer: Self-pay | Admitting: Surgery

## 2022-12-12 NOTE — H&P (Signed)
History of Present Illness: Daniel Mcdowell is a 72 y.o. male who is seen today as an office consultation for evaluation of New Consultation   He was seen in the ED in May with bleeding per rectum, and was noted to have reducible internal hemorrhoids. He also had an umbilical and left inguinal hernia, which were reducible. Since his ED visit he says he has not had any further bleeding and denies any other symptoms from his hemorrhoids. His last colonoscopy was at age 57 in [place] (he moved to [place] a few years ago and does not have a local GI provider yet). He did have a negative Cologuard test in August 2022.   He says he has had the inguinal hernia for at least 5 years, and it does not really bother him. It is reducible. He first noticed the umbilical hernia about a year ago after having a cough. It usually reduces, but sometimes becomes firm and very painful. When he has pain he wears a belt over it, which helps with the symptoms.   He has never had any prior abdominal surgeries. He takes a baby aspirin daily but no other blood thinners. He goes on long walks every day for exercise. He does not smoke.       Review of Systems: A complete review of systems was obtained from the patient.  I have reviewed this information and discussed as appropriate with the patient.  See HPI as well for other ROS.     Medical History: Past Medical History Past Medical History: Diagnosis Date  Arthritis    Hyperlipidemia         Problem List There is no problem list on file for this patient.     Past Surgical History History reviewed. No pertinent surgical history.     Allergies No Known Allergies    Medications Ordered Prior to Encounter Current Outpatient Medications on File Prior to Visit Medication Sig Dispense Refill  aspirin 81 MG EC tablet Take 81 mg by mouth once daily      atorvastatin (LIPITOR) 20 MG tablet Take 20 mg by mouth once daily      carvediloL (COREG) 12.5 MG tablet Take 12.5  mg by mouth 2 (two) times daily with meals      doxazosin (CARDURA) 2 MG tablet Take 2 mg by mouth once daily      lisinopriL (ZESTRIL) 40 MG tablet Take 40 mg by mouth once daily      metFORMIN (GLUCOPHAGE-XR) 500 MG XR tablet TAKE 1 TABLET BY MOUTH DAILY FOR 3 WEEKS THEN ONE BY MOUTH TWICE A DAY      omeprazole (PRILOSEC) 20 MG DR capsule Take 20 mg by mouth once daily        No current facility-administered medications on file prior to visit.      Family History Family History Problem Relation Age of Onset  High blood pressure (Hypertension) Father    Coronary Artery Disease (Blocked arteries around heart) Father    Diabetes Sister        Tobacco Use History Social History    Tobacco Use Smoking Status Never Smokeless Tobacco Never      Social History Social History    Socioeconomic History  Marital status: Married Tobacco Use  Smoking status: Never  Smokeless tobacco: Never Substance and Sexual Activity  Alcohol use: Not Currently  Drug use: Never      Objective:   Vitals:   12/12/22 0839 BP: (!) 162/98 Pulse: 70 Temp:  36.7 C (98 F) SpO2: 98% Weight: (!) 101.2 kg (223 lb) Height: 175.3 cm (5\' 9" )   Body mass index is 32.93 kg/m.   Physical Exam Vitals reviewed.  Constitutional:      General: He is not in acute distress.    Appearance: Normal appearance.  HENT:     Head: Normocephalic and atraumatic.  Eyes:     General: No scleral icterus.    Conjunctiva/sclera: Conjunctivae normal.  Cardiovascular:     Rate and Rhythm: Normal rate and regular rhythm.     Heart sounds: No murmur heard. Pulmonary:     Effort: Pulmonary effort is normal. No respiratory distress.     Breath sounds: Normal breath sounds. No wheezing.  Abdominal:     General: There is no distension.     Palpations: Abdomen is soft.     Comments: Umbilical hernia is mildly tender, reducible in the supine position with firm pressure. Left inguinal hernia is soft and  reducible in the supine position.  Musculoskeletal:        General: Normal range of motion.  Skin:    General: Skin is warm and dry.     Coloration: Skin is not jaundiced.  Neurological:     General: No focal deficit present.     Mental Status: He is alert and oriented to person, place, and time.            Labs, Imaging and Diagnostic Testing: CT abd/pelvis 10/20/22: IMPRESSION: 1. Nonobstructive 1.6 cm left nephrolithiasis. 2. Moderate volume left inguinal hernia containing a short loop of sigmoid/distal descending colon. Small to moderate volume umbilical hernia containing a short loop of small bowel. No associated findings of small-bowel obstruction. No findings to suggest ischemia. 3. Colonic diverticulosis with no acute diverticulitis. 4. Bilateral small hydroceles. 5.  Aortic Atherosclerosis (ICD10-I70.0). 6. Unable to evaluate for gastrointestinal hemorrhage on this single phase study.     Assessment and Plan:   Assessment Diagnoses and all orders for this visit:   Umbilical hernia without obstruction and without gangrene -     CCS Case Posting Request; Future   Left inguinal hernia -     CCS Case Posting Request; Future   Bleeding per rectum -     Ambulatory Referral to Gastroenterology     72 yo male presenting with umbilical and left inguinal hernias. They are reducible, however he has frequent symptoms from the umbilical hernia. Both hernias contain bowel based on his CT scan. I discussed the risks vs benefits of surgical repair vs continued observation. I recommended repair of at least the umbilical hernia as it contains bowel and he frequently has pain. He would like to proceed with repair of both hernias. Given the fairly large size of the inguinal hernia, I will plan for open repair of both, with mesh. I reviewed the details of surgery including the benefits and risks, as well as the postoperative activity restrictions. He expressed understanding and agrees  to proceed. He will be contacted to schedule an elective surgery date. He does not need to hold aspirin for surgery.   He also needs a colonoscopy for workup of his recent bleeding per rectum to rule out an underlying malignancy. Referral placed to gastroenterology.   Sophronia Simas, MD [facility] Surgery General, Hepatobiliary and Pancreatic Surgery 12/12/22 9:20 AM

## 2022-12-12 NOTE — H&P (View-Only) (Signed)
History of Present Illness: Daniel Mcdowell is a 72 y.o. male who is seen today as an office consultation for evaluation of New Consultation   He was seen in the ED in May with bleeding per rectum, and was noted to have reducible internal hemorrhoids. He also had an umbilical and left inguinal hernia, which were reducible. Since his ED visit he says he has not had any further bleeding and denies any other symptoms from his hemorrhoids. His last colonoscopy was at age 57 in [place] (he moved to [place] a few years ago and does not have a local GI provider yet). He did have a negative Cologuard test in August 2022.   He says he has had the inguinal hernia for at least 5 years, and it does not really bother him. It is reducible. He first noticed the umbilical hernia about a year ago after having a cough. It usually reduces, but sometimes becomes firm and very painful. When he has pain he wears a belt over it, which helps with the symptoms.   He has never had any prior abdominal surgeries. He takes a baby aspirin daily but no other blood thinners. He goes on long walks every day for exercise. He does not smoke.       Review of Systems: A complete review of systems was obtained from the patient.  I have reviewed this information and discussed as appropriate with the patient.  See HPI as well for other ROS.     Medical History: Past Medical History Past Medical History: Diagnosis Date  Arthritis    Hyperlipidemia         Problem List There is no problem list on file for this patient.     Past Surgical History History reviewed. No pertinent surgical history.     Allergies No Known Allergies    Medications Ordered Prior to Encounter Current Outpatient Medications on File Prior to Visit Medication Sig Dispense Refill  aspirin 81 MG EC tablet Take 81 mg by mouth once daily      atorvastatin (LIPITOR) 20 MG tablet Take 20 mg by mouth once daily      carvediloL (COREG) 12.5 MG tablet Take 12.5  mg by mouth 2 (two) times daily with meals      doxazosin (CARDURA) 2 MG tablet Take 2 mg by mouth once daily      lisinopriL (ZESTRIL) 40 MG tablet Take 40 mg by mouth once daily      metFORMIN (GLUCOPHAGE-XR) 500 MG XR tablet TAKE 1 TABLET BY MOUTH DAILY FOR 3 WEEKS THEN ONE BY MOUTH TWICE A DAY      omeprazole (PRILOSEC) 20 MG DR capsule Take 20 mg by mouth once daily        No current facility-administered medications on file prior to visit.      Family History Family History Problem Relation Age of Onset  High blood pressure (Hypertension) Father    Coronary Artery Disease (Blocked arteries around heart) Father    Diabetes Sister        Tobacco Use History Social History    Tobacco Use Smoking Status Never Smokeless Tobacco Never      Social History Social History    Socioeconomic History  Marital status: Married Tobacco Use  Smoking status: Never  Smokeless tobacco: Never Substance and Sexual Activity  Alcohol use: Not Currently  Drug use: Never      Objective:   Vitals:   12/12/22 0839 BP: (!) 162/98 Pulse: 70 Temp:  36.7 C (98 F) SpO2: 98% Weight: (!) 101.2 kg (223 lb) Height: 175.3 cm (5\' 9" )   Body mass index is 32.93 kg/m.   Physical Exam Vitals reviewed.  Constitutional:      General: He is not in acute distress.    Appearance: Normal appearance.  HENT:     Head: Normocephalic and atraumatic.  Eyes:     General: No scleral icterus.    Conjunctiva/sclera: Conjunctivae normal.  Cardiovascular:     Rate and Rhythm: Normal rate and regular rhythm.     Heart sounds: No murmur heard. Pulmonary:     Effort: Pulmonary effort is normal. No respiratory distress.     Breath sounds: Normal breath sounds. No wheezing.  Abdominal:     General: There is no distension.     Palpations: Abdomen is soft.     Comments: Umbilical hernia is mildly tender, reducible in the supine position with firm pressure. Left inguinal hernia is soft and  reducible in the supine position.  Musculoskeletal:        General: Normal range of motion.  Skin:    General: Skin is warm and dry.     Coloration: Skin is not jaundiced.  Neurological:     General: No focal deficit present.     Mental Status: He is alert and oriented to person, place, and time.            Labs, Imaging and Diagnostic Testing: CT abd/pelvis 10/20/22: IMPRESSION: 1. Nonobstructive 1.6 cm left nephrolithiasis. 2. Moderate volume left inguinal hernia containing a short loop of sigmoid/distal descending colon. Small to moderate volume umbilical hernia containing a short loop of small bowel. No associated findings of small-bowel obstruction. No findings to suggest ischemia. 3. Colonic diverticulosis with no acute diverticulitis. 4. Bilateral small hydroceles. 5.  Aortic Atherosclerosis (ICD10-I70.0). 6. Unable to evaluate for gastrointestinal hemorrhage on this single phase study.     Assessment and Plan:   Assessment Diagnoses and all orders for this visit:   Umbilical hernia without obstruction and without gangrene -     CCS Case Posting Request; Future   Left inguinal hernia -     CCS Case Posting Request; Future   Bleeding per rectum -     Ambulatory Referral to Gastroenterology     72 yo male presenting with umbilical and left inguinal hernias. They are reducible, however he has frequent symptoms from the umbilical hernia. Both hernias contain bowel based on his CT scan. I discussed the risks vs benefits of surgical repair vs continued observation. I recommended repair of at least the umbilical hernia as it contains bowel and he frequently has pain. He would like to proceed with repair of both hernias. Given the fairly large size of the inguinal hernia, I will plan for open repair of both, with mesh. I reviewed the details of surgery including the benefits and risks, as well as the postoperative activity restrictions. He expressed understanding and agrees  to proceed. He will be contacted to schedule an elective surgery date. He does not need to hold aspirin for surgery.   He also needs a colonoscopy for workup of his recent bleeding per rectum to rule out an underlying malignancy. Referral placed to gastroenterology.   Sophronia Simas, MD [facility] Surgery General, Hepatobiliary and Pancreatic Surgery 12/12/22 9:20 AM

## 2022-12-22 ENCOUNTER — Encounter (HOSPITAL_COMMUNITY): Payer: Self-pay

## 2022-12-22 NOTE — Progress Notes (Signed)
PCP - Dr Nadyne Coombes Cardiologist - n/a  Chest x-ray - n/a EKG - 12/25/22 Stress Test - n/a ECHO - 04/04/19 Cardiac Cath - n/a  ICD Pacemaker/Loop - n/a  Sleep Study -  n/a CPAP - none  Diabetes Type - n/a Do not take Metformin on the morning of surgery.  If your blood sugar is less than 70 mg/dL, you will need to treat for low blood sugar: Treat a low blood sugar (less than 70 mg/dL) with  cup of clear juice (cranberry or apple), 4 glucose tablets, OR glucose gel. Recheck blood sugar in 15 minutes after treatment (to make sure it is greater than 70 mg/dL). If your blood sugar is not greater than 70 mg/dL on recheck, call [phone] for further instructions.  Aspirin Instructions: Follow your surgeon's instructions on when to stop aspirin prior to surgery,  If no instructions were given by your surgeon then you will need to call the office for those instructions.  ERAS: Clear liquids til 9:30 AM DOS.  Anesthesia review: Yes  STOP now taking any Aspirin (unless otherwise instructed by your surgeon), Aleve, Naproxen, Ibuprofen, Motrin, Advil, Goody's, BC's, all herbal medications, fish oil, and all vitamins.   Coronavirus Screening Do you have any of the following symptoms:  Cough yes/no: No Fever (>100.33F)  yes/no: No Runny nose yes/no: No Sore throat yes/no: No Difficulty breathing/shortness of breath  yes/no: No  Have you traveled in the last 14 days and where? yes/no: No  Patient verbalized understanding of instructions that were given to them at the PAT appointment. Patient was also instructed that they will need to review over the PAT instructions again at home before surgery.

## 2022-12-22 NOTE — Pre-Procedure Instructions (Signed)
Surgical Instructions    Your procedure is scheduled on December 29, 2022.  Report to [facility] Main Entrance "A" at 10:30 A.M., then check in with the Admitting office.  Call this number if you have problems the morning of surgery:  [phone]   If you have any questions prior to your surgery date call [phone]: Open Monday-Friday 8am-4pm If you experience any cold or flu symptoms such as cough, fever, chills, shortness of breath, etc. between now and your scheduled surgery, please notify us at the above number     Remember:  Do not eat after midnight the night before your surgery  You may drink clear liquids until 9:30AM the morning of your surgery.   Clear liquids allowed are: Water, Non-Citrus Juices (without pulp), Carbonated Beverages, Clear Tea, Black Coffee ONLY (NO MILK, CREAM OR POWDERED CREAMER of any kind), and Gatorade    Take these medicines the morning of surgery with A SIP OF WATER:  atorvastatin (LIPITOR)  carvedilol (COREG)  doxazosin (CARDURA)  omeprazole (PRILOSEC)   DO NOT take Metformin (Glucophage) the day of surgery.   Follow your surgeon's instructions on when to stop Aspirin.  If no instructions were given by your surgeon then you will need to call the office to get those instructions.    As of today, STOP taking any Aleve, Naproxen, Ibuprofen, Motrin, Advil, Goody's, BC's, all herbal medications, fish oil, and all vitamins.            [facility] is not responsible for any belongings or valuables.    Do NOT Smoke (Tobacco/Vaping)  24 hours prior to your procedure  If you use a CPAP at night, you may bring your mask for your overnight stay.   Contacts, glasses, hearing aids, dentures or partials may not be worn into surgery, please bring cases for these belongings   For patients admitted to the hospital, discharge time will be determined by your treatment team.   Patients discharged the day of surgery will not be allowed to drive home, and  someone needs to stay with them for 24 hours.   SURGICAL WAITING ROOM VISITATION Patients having surgery or a procedure may have no more than 2 support people in the waiting area - these visitors may rotate.   Children under the age of 69 must have an adult with them who is not the patient. If the patient needs to stay at the hospital during part of their recovery, the visitor guidelines for inpatient rooms apply. Pre-op nurse will coordinate an appropriate time for 1 support person to accompany patient in pre-op.  This support person may not rotate.   Please refer to [email or web address] for the visitor guidelines for Inpatients (after your surgery is over and you are in a regular room).    Special instructions:    Oral Hygiene is also important to reduce your risk of infection.  Remember - BRUSH YOUR TEETH THE MORNING OF SURGERY WITH YOUR REGULAR TOOTHPASTE   [facility]- Preparing For Surgery  Before surgery, you can play an important role. Because skin is not sterile, your skin needs to be as free of germs as possible. You can reduce the number of germs on your skin by washing with CHG (chlorahexidine gluconate) Soap before surgery.  CHG is an antiseptic cleaner which kills germs and bonds with the skin to continue killing germs even after washing.     Please do not use if you have an allergy to CHG or antibacterial soaps. If  your skin becomes reddened/irritated stop using the CHG.  Do not shave (including legs and underarms) for at least 48 hours prior to first CHG shower. It is OK to shave your face.  Please follow these instructions carefully.     Shower the NIGHT BEFORE SURGERY and the MORNING OF SURGERY with CHG Soap.   If you chose to wash your hair, wash your hair first as usual with your normal shampoo. After you shampoo, rinse your hair and body thoroughly to remove the shampoo.  Then [employer] and genitals (private parts)  with your normal soap and rinse thoroughly to remove soap.  After that Use CHG Soap as you would any other liquid soap. You can apply CHG directly to the skin and wash gently with a scrungie or a clean washcloth.   Apply the CHG Soap to your body ONLY FROM THE NECK DOWN.  Do not use on open wounds or open sores. Avoid contact with your eyes, ears, mouth and genitals (private parts). Wash Face and genitals (private parts)  with your normal soap.   Wash thoroughly, paying special attention to the area where your surgery will be performed.  Thoroughly rinse your body with warm water from the neck down.  DO NOT shower/wash with your normal soap after using and rinsing off the CHG Soap.  Pat yourself dry with a CLEAN TOWEL.  Wear CLEAN PAJAMAS to bed the night before surgery  Place CLEAN SHEETS on your bed the night before your surgery  DO NOT SLEEP WITH PETS.   Day of Surgery:  Take a shower with CHG soap. Wear Clean/Comfortable clothing the morning of surgery Do not wear jewelry or makeup. Do not wear lotions, powders, perfumes/cologne or deodorant. Do not shave 48 hours prior to surgery.  Men may shave face and neck. Do not bring valuables to the hospital. Do not wear nail polish, gel polish, artificial nails, or any other type of covering on natural nails (fingers and toes) If you have artificial nails or gel coating that need to be removed by a nail salon, please have this removed prior to surgery. Artificial nails or gel coating may interfere with anesthesia's ability to adequately monitor your vital signs. Remember to brush your teeth WITH YOUR REGULAR TOOTHPASTE.    If you received a COVID test during your pre-op visit, it is requested that you wear a mask when out in public, stay away from anyone that may not be feeling well, and notify your surgeon if you develop symptoms. If you have been in contact with anyone that has tested positive in the last 10 days, please notify your  surgeon.    Please read over the following fact sheets that you were given.

## 2022-12-22 NOTE — Pre-Procedure Instructions (Signed)
Surgical Instructions    Your procedure is scheduled on Friday, December 29, 2022.  Report to [facility] Main Entrance "A" at 10:30 A.M., then check in with the Admitting office.  Call this number if you have problems the morning of surgery:  [phone]   If you have any questions prior to your surgery date call [phone]: Open Monday-Friday 8am-4pm If you experience any cold or flu symptoms such as cough, fever, chills, shortness of breath, etc. between now and your scheduled surgery, please notify us at the above number     Remember:  Do not eat after midnight the night before your surgery  You may drink clear liquids until 9:30AM the morning of your surgery.   Clear liquids allowed are: Water, Non-Citrus Juices (without pulp), Carbonated Beverages, Clear Tea, Black Coffee ONLY (NO MILK, CREAM OR POWDERED CREAMER of any kind), and Gatorade  Please complete your PRE-SURGERY G2 Drink  that was provided to you by 9:30 AM, the morning of surgery.  Please, if able, drink it in one setting. DO NOT SIP.    Take these medicines the morning of surgery with A SIP OF WATER:  atorvastatin (LIPITOR)  carvedilol (COREG)  doxazosin (CARDURA)  omeprazole (PRILOSEC)   DO NOT take Metformin (Glucophage) the day of surgery.   Follow your surgeon's instructions on when to stop Aspirin.  If no instructions were given by your surgeon then you will need to call the office to get those instructions.    As of today, STOP taking any Aleve, Naproxen, Ibuprofen, Motrin, Advil, Goody's, BC's, all herbal medications, fish oil, and all vitamins.  [facility] is not responsible for any belongings or valuables.    Do NOT Smoke (Tobacco/Vaping)  24 hours prior to your procedure   Contacts, glasses, hearing aids, dentures or partials may not be worn into surgery, please bring cases for these belongings   Patients discharged the day of surgery will not be allowed to drive home, and someone needs to stay with  them for 24 hours.   SURGICAL WAITING ROOM VISITATION Patients having surgery or a procedure may have no more than 2 support people in the waiting area - these visitors may rotate.   Children under the age of 23 must have an adult with them who is not the patient. If the patient needs to stay at the hospital during part of their recovery, the visitor guidelines for inpatient rooms apply. Pre-op nurse will coordinate an appropriate time for 1 support person to accompany patient in pre-op.  This support person may not rotate.   Please refer to [email or web address] for the visitor guidelines for Inpatients (after your surgery is over and you are in a regular room).    Special instructions:    Oral Hygiene is also important to reduce your risk of infection.  Remember - BRUSH YOUR TEETH THE MORNING OF SURGERY WITH YOUR REGULAR TOOTHPASTE   - Preparing For Surgery  Before surgery, you can play an important role. Because skin is not sterile, your skin needs to be as free of germs as possible. You can reduce the number of germs on your skin by washing with CHG (chlorahexidine gluconate) Soap before surgery.  CHG is an antiseptic cleaner which kills germs and bonds with the skin to continue killing germs even after washing.     Please do not use if you have an allergy to CHG or antibacterial soaps. If your skin becomes reddened/irritated stop using the CHG.  Do not  shave (including legs and underarms) for at least 48 hours prior to first CHG shower. It is OK to shave your face.  Please follow these instructions carefully.     Shower the NIGHT BEFORE SURGERY-Thursday and the MORNING OF SURGERY-Friday with CHG Soap.   If you chose to wash your hair, wash your hair first as usual with your normal shampoo. After you shampoo, rinse your hair and body thoroughly to remove the shampoo.  Then [employer] and genitals (private parts) with your  normal soap and rinse thoroughly to remove soap.  After that Use CHG Soap as you would any other liquid soap. You can apply CHG directly to the skin and wash gently with a scrungie or a clean washcloth.   Apply the CHG Soap to your body ONLY FROM THE NECK DOWN.  Do not use on open wounds or open sores. Avoid contact with your eyes, ears, mouth and genitals (private parts). Wash Face and genitals (private parts)  with your normal soap.   Wash thoroughly, paying special attention to the area where your surgery will be performed.  Thoroughly rinse your body with warm water from the neck down.  DO NOT shower/wash with your normal soap after using and rinsing off the CHG Soap.  Pat yourself dry with a CLEAN TOWEL.  Wear CLEAN PAJAMAS to bed the night before surgery  Place CLEAN SHEETS on your bed the night before your surgery  DO NOT SLEEP WITH PETS.   Day of Surgery:  Take a shower with CHG soap. Wear Clean/Comfortable clothing the morning of surgery Do not wear jewelry  Do not wear lotions, powders, cologne or deodorant. Men may shave face and neck. Do not bring valuables to the hospital.  Remember to brush your teeth WITH YOUR REGULAR TOOTHPASTE.    If you received a COVID test during your pre-op visit, it is requested that you wear a mask when out in public, stay away from anyone that may not be feeling well, and notify your surgeon if you develop symptoms. If you have been in contact with anyone that has tested positive in the last 10 days, please notify your surgeon.    Please read over the following fact sheets that you were given.

## 2022-12-25 ENCOUNTER — Other Ambulatory Visit: Payer: Self-pay

## 2022-12-25 ENCOUNTER — Encounter (HOSPITAL_COMMUNITY)
Admission: RE | Admit: 2022-12-25 | Discharge: 2022-12-25 | Disposition: A | Discharge status: Home / Self Care | Payer: Medicare Other | Source: Ambulatory Visit | Attending: Surgery | Admitting: Surgery

## 2022-12-25 ENCOUNTER — Encounter (HOSPITAL_COMMUNITY): Payer: Self-pay

## 2022-12-25 VITALS — BP 178/104 | HR 65 | Temp 98.4°F | Resp 18 | Ht 69.0 in | Wt 221.0 lb

## 2022-12-25 DIAGNOSIS — E785 Hyperlipidemia, unspecified: Secondary | ICD-10-CM | POA: Insufficient documentation

## 2022-12-25 DIAGNOSIS — Z7984 Long term (current) use of oral hypoglycemic drugs: Secondary | ICD-10-CM | POA: Insufficient documentation

## 2022-12-25 DIAGNOSIS — K219 Gastro-esophageal reflux disease without esophagitis: Secondary | ICD-10-CM | POA: Insufficient documentation

## 2022-12-25 DIAGNOSIS — I119 Hypertensive heart disease without heart failure: Secondary | ICD-10-CM | POA: Insufficient documentation

## 2022-12-25 DIAGNOSIS — E119 Type 2 diabetes mellitus without complications: Secondary | ICD-10-CM | POA: Diagnosis not present

## 2022-12-25 DIAGNOSIS — K429 Umbilical hernia without obstruction or gangrene: Secondary | ICD-10-CM | POA: Diagnosis not present

## 2022-12-25 DIAGNOSIS — Z01818 Encounter for other preprocedural examination: Secondary | ICD-10-CM | POA: Insufficient documentation

## 2022-12-25 DIAGNOSIS — K4090 Unilateral inguinal hernia, without obstruction or gangrene, not specified as recurrent: Secondary | ICD-10-CM | POA: Diagnosis not present

## 2022-12-25 DIAGNOSIS — Z7982 Long term (current) use of aspirin: Secondary | ICD-10-CM | POA: Insufficient documentation

## 2022-12-25 DIAGNOSIS — Z79899 Other long term (current) drug therapy: Secondary | ICD-10-CM | POA: Insufficient documentation

## 2022-12-25 LAB — BASIC METABOLIC PANEL WITH GFR
Anion gap: 4 — ABNORMAL LOW (ref 5–15)
BUN: 20 mg/dL (ref 8–23)
CO2: 29 mmol/L (ref 22–32)
Calcium: 9 mg/dL (ref 8.9–10.3)
Chloride: 108 mmol/L (ref 98–111)
Creatinine, Ser: 1.02 mg/dL (ref 0.61–1.24)
GFR, Estimated: >60 mL/min
Glucose, Bld: 119 mg/dL — ABNORMAL HIGH (ref 70–99)
Potassium: 4.5 mmol/L (ref 3.5–5.1)
Sodium: 141 mmol/L (ref 135–145)

## 2022-12-25 LAB — CBC
HCT: 42 % (ref 39.0–52.0)
Hemoglobin: 13.5 g/dL (ref 13.0–17.0)
MCH: 30.1 pg (ref 26.0–34.0)
MCHC: 32.1 g/dL (ref 30.0–36.0)
MCV: 93.5 fL (ref 80.0–100.0)
Platelets: 262 10*3/uL (ref 150–400)
RBC: 4.49 MIL/uL (ref 4.22–5.81)
RDW: 12.9 % (ref 11.5–15.5)
WBC: 5.1 10*3/uL (ref 4.0–10.5)
nRBC: 0 % (ref 0.0–0.2)

## 2022-12-25 NOTE — Anesthesia Preprocedure Evaluation (Addendum)
Anesthesia Evaluation  Patient identified by MRN, date of birth, ID band Patient awake    Reviewed: Allergy & Precautions, H&P , NPO status , Patient's Chart, lab work & pertinent test results, reviewed documented beta blocker date and time   Airway Mallampati: II  TM Distance: >3 FB Neck ROM: Full    Dental no notable dental hx. (+) Teeth Intact   Pulmonary neg pulmonary ROS   Pulmonary exam normal breath sounds clear to auscultation       Cardiovascular hypertension, Pt. on medications and Pt. on home beta blockers  Rhythm:Regular Rate:Normal     Neuro/Psych negative neurological ROS  negative psych ROS   GI/Hepatic Neg liver ROS,GERD  Medicated,,  Endo/Other  diabetes, Type 2, Oral Hypoglycemic Agents    Renal/GU negative Renal ROS  negative genitourinary   Musculoskeletal   Abdominal   Peds  Hematology  (+) Blood dyscrasia, anemia   Anesthesia Other Findings   Reproductive/Obstetrics negative OB ROS                             Anesthesia Physical Anesthesia Plan  ASA: 2  Anesthesia Plan: General   Post-op Pain Management: Tylenol PO (pre-op)*   Induction: Intravenous  PONV Risk Score and Plan: 3 and Ondansetron, Dexamethasone and Treatment may vary due to age or medical condition  Airway Management Planned: Oral ETT  Additional Equipment:   Intra-op Plan:   Post-operative Plan: Extubation in OR  Informed Consent: I have reviewed the patients History and Physical, chart, labs and discussed the procedure including the risks, benefits and alternatives for the proposed anesthesia with the patient or authorized representative who has indicated his/her understanding and acceptance.     Dental advisory given  Plan Discussed with: CRNA  Anesthesia Plan Comments: (See PAT note written by Shonna Chock, PA-C. Reported White Coat HTN. On doxazosin 2 mg Q HS, Coreg 25 mg BID,  lisinopril 40 mg daily. Monitors home BP reading using an APP. Average BP in the last 2 weeks 126/81 with range of 102/69 (after walking) - 151/91. He walks 4-5 miles 4-6 x/week. Never smoker.   )       Anesthesia Quick Evaluation

## 2022-12-25 NOTE — Progress Notes (Signed)
Anesthesia PAT APP Evaluation:  Case: [id number] Date/Time: 12/29/22 1215   Procedures:      HERNIA REPAIR UMBILICAL ADULT WITH MESH     OPEN LEFT INGUINAL HERNIA REPAIR WITH MESH   Anesthesia type: General   Pre-op diagnosis: UMBILICAL HERNIA LEFT INGUINAL HERNIA   [facility] / [facility]   Surgeons: Fritzi Mandes, MD       DISCUSSION: Patient is a 72 year old male scheduled for the above procedure.  History includes never smoker, HTN, HLD, DM2, GERD, nephrolithiasis.  I evaluated Daniel Mcdowell during his PAT visit due to elevated BP (see below). Meds include doxazosin 2 mg Q HS, coreg 25 mg BID, lisinopril 40 mg daily. He took morning medications about 90 minutes prior arrival. He reported "white coat" hypertension and is diligent in keeping records of home BP readings using an APP on his smart phone. He showed me readings from at least that past two weeks with highest BP 151/91 and lowest 102/69 after walking. (For a few days following his ED visit in May, BP readings were higher ~ 148-167/98-102, but otherwise elevated readings have been specifically at medical appointments with some improvement after being able to sit and relax.) He checks his BP twice a day--typically just after taking his medications and then late morning after he walks ~ 4-5 miles (he walks ~ 4-6 times/week and averages about 9,000 steps/day). He typically uses his LUE, but says that he periodically checks both arms to ensure not significant discrepancies. His father died of an MI at age 53 years old, so he tries to stay active and be compliant with medical regimen. He is also on ASA 81 mg daily, Lipitor 20 mg daily, and metformin 500 mg BID. He says his A1c ~ 2 months ago was 6.5%. His walking regimen can be both indoors track) or outdoors but does include inclines.   He denied chest pain, SOB, palpitations, edema. He did have an episode of possible syncope in 03/2019 after he slipped on wet stairs while wearing flip  flops. He landed hard on his left side and also had a small abrasion to his posterior head. He had syncope or near syncope following the fall but in setting of of large 11.4 x 4.5 x 18.5 cm hematoma overlying his left hip region, requiring PRBC for HGB 7.4. Echo was done then on 04/04/19 showing hyperdynamic LVEF 70-75% and intracavitary gradient during systole 150 mmHg, moderately increased LVH, grade 1 diastolic dysfunction, normal RV systolic function, mild MR/TR. He denied any further syncope or presyncope.   He has a symptomatic umbilical hernia. No current issues with bowel movements. He does wear abdominal compression when he is up and out of the house, otherwise he has developed more pain and firmness around the hernia. Based on his reported history, review of home BP trends, I suspect he does have a degree of white coat hypertension with underlying hypertension, and that readings in PAT are likely further increased due to him wearing his abdominal binder. (He does not wear his binder when checking home BP readings.). His pain level ~ 3-4/10. He is also anxious about surgery and about his BP being high. Last office notes and A1c also requested from his PCP Dr. Hal Hope. He says he see her at least 2-3 times/year for HTN and DM follow-up, last ~ May 2024.     Will review additional records received. In the interim, available information reviewed with anesthesiologist Jairo Ben, MD. Patient  to continue anti-hypertensives as prescribed, but will continue with plans to hold lisinopril on the morning of surgery given lower late morning BP readings in the last few weeks. He is not planning to wear his abdominal binder on the morning of surgery. He will contact Dr. Eliot Ford office for preoperative ASA instructions.  ADDENDUM 12/28/22 9:51 AM: Records received from [facility] including last labs from 11/07/22. A1c 6.5%, TSH 1.87 at that time. Office BP recorded as 120/78. She reviewed his home BP readings  and noted generally normal at home, and that he was compliant with medications. She did not make any medication adjustments. She referred him to general surgery for hernia. 3 month follow-up planned.   Of note, case is now moved to [facility]. As above hypertensive at [facility] PAT, but meticulous BP monitoring at home with overall good control. He was quite anxious about surgery and self reported white coat hypertension. Abdominal binder may have also been contributing. Discussed with anesthesiologist then about consideration of having him take all antihypertensives on the morning of surgery, but given some lower BP readings at home a decision made to have him hold lisinopril which is part of our usual protocols. Could consider giving it on the day of surgery should he arrival with elevated BP. BP also tends to improve once he rests or relaxes a bit.   VS: BP (!) 212/111   Pulse 65   Temp 36.9 C   Resp 18   Ht 5\' 9"  (1.753 m)   Wt 100.2 kg   SpO2 99%   BMI 32.64 kg/m   12/25/22 ([facility] PAT): BP 218/102->212/112 (automated)->178/104 manual LUE.  12/12/22 ([facility]): BP 162/98 (He reported his initial BP ~ 201/100 then down to 162/98 on recheck) 10/19/22 ([facility]): BP 184/98 Mr. Cork is a pleasant male in NAD. He reported feeling uptight because he knows his BP will be elevated at appointments and is concerned it will affect surgery plans. See above. Heart RRR. I did not appreciate a murmur. Lungs clear. No ankle edema noted.     PROVIDERS: Dois Davenport, MD [facility]; [phone])   LABS: Labs reviewed: Acceptable for surgery. (all labs ordered are listed, but only abnormal results are displayed)  Labs Reviewed  BASIC METABOLIC PANEL - Abnormal; Notable for the following components:      Result Value   Glucose, Bld 119 (*)    Anion gap 4 (*)    All other components within normal limits  CBC     IMAGES: CT Abd/pelvis 10/20/22: IMPRESSION: 1. Nonobstructive 1.6 cm left  nephrolithiasis. 2. Moderate volume left inguinal hernia containing a short loop of sigmoid/distal descending colon. Small to moderate volume umbilical hernia containing a short loop of small bowel. No associated findings of small-bowel obstruction. No findings to suggest ischemia. 3. Colonic diverticulosis with no acute diverticulitis. 4. Bilateral small hydroceles. 5.  Aortic Atherosclerosis (ICD10-I70.0). 6. Unable to evaluate for gastrointestinal hemorrhage on this single phase study.   EKG: EKG: SB at 59 bpm   CV: Echo 04/04/19: IMPRESSIONS   1. Left ventricular ejection fraction, by visual estimation, is 70 to  75%. The left ventricle has hyperdynamic function. There is moderately  increased left ventricular hypertrophy.   2. Left ventricular diastolic parameters are consistent with Grade I  diastolic dysfunction (impaired relaxation).   3. Global right ventricle has normal systolic function.The right  ventricular size is normal. No increase in right ventricular wall  thickness.   4. Left atrial  size was mildly dilated.   5. Right atrial size was normal.   6. The mitral valve is normal in structure. Mild mitral valve  regurgitation. No evidence of mitral stenosis.   7. The tricuspid valve is normal in structure. Tricuspid valve  regurgitation is mild.   8. The aortic valve is normal in structure. Aortic valve regurgitation is  not visualized. No evidence of aortic valve sclerosis or stenosis.   9. The pulmonic valve was normal in structure. Pulmonic valve  regurgitation is not visualized.  10. The inferior vena cava is normal in size with greater than 50%  respiratory variability, suggesting right atrial pressure of 3 mmHg.  11. LVEF is hyperdynamic with intracavitary gradient during systole 150  mmHg.     Past Medical History:  Diagnosis Date   Diabetes mellitus without complication (HCC)    type 2 - on metformin   GERD (gastroesophageal reflux disease)     Hemorrhoids    History of kidney stones    nephrolithiasis - surgery to remove   HLD (hyperlipidemia)    Hypertension     Past Surgical History:  Procedure Laterality Date   COLONOSCOPY     at age 74 yrs   LITHOTRIPSY     wisdom tooth removal      MEDICATIONS:  aspirin EC 81 MG tablet   atorvastatin (LIPITOR) 20 MG tablet   carvedilol (COREG) 25 MG tablet   Cholecalciferol (VITAMIN D) 50 MCG (2000 UT) tablet   CINNAMON PO   doxazosin (CARDURA) 2 MG tablet   hydrocortisone (ANUSOL-HC) 2.5 % rectal cream   lisinopril (ZESTRIL) 40 MG tablet   metFORMIN (GLUCOPHAGE-XR) 500 MG 24 hr tablet   omeprazole (PRILOSEC) 20 MG capsule   Prenatal MV-Min-Fe Fum-FA-DHA (PRENATAL/FOLIC ACID+DHA PO)   No current facility-administered medications for this encounter.     Shonna Chock, PA-C Surgical Short Stay/Anesthesiology [facility] Phone [phone] [facility] Phone [phone] 12/25/2022 4:23 PM

## 2022-12-27 ENCOUNTER — Encounter (HOSPITAL_COMMUNITY): Payer: Self-pay | Admitting: Surgery

## 2022-12-27 NOTE — Progress Notes (Signed)
Updated date of surgery: 12/29/22  Updated time of arrival: 9:30 AM at [facility] Entrance Admitting Department  Patient will be discharged from hospital and monitored at home for 24 hours by: Lawson Fiscal (wife) [phone]  Patient denies any changes in allergies, medications, medical history since pre op appointment on: 12/25/22  Pre op instructions reviewed, follow up questions addressed and patient verbalized understanding at this time.

## 2022-12-29 ENCOUNTER — Other Ambulatory Visit: Payer: Self-pay

## 2022-12-29 ENCOUNTER — Ambulatory Visit (HOSPITAL_BASED_OUTPATIENT_CLINIC_OR_DEPARTMENT_OTHER): Payer: Medicare Other | Admitting: Anesthesiology

## 2022-12-29 ENCOUNTER — Encounter (HOSPITAL_COMMUNITY)
Admission: RE | Disposition: A | Discharge status: Home / Self Care | Payer: Self-pay | Source: Home / Self Care | Attending: Surgery

## 2022-12-29 ENCOUNTER — Encounter (HOSPITAL_COMMUNITY): Payer: Self-pay | Admitting: Surgery

## 2022-12-29 ENCOUNTER — Ambulatory Visit (HOSPITAL_COMMUNITY)
Admission: RE | Admit: 2022-12-29 | Discharge: 2022-12-29 | Disposition: A | Discharge status: Home / Self Care | Payer: Medicare Other | Source: Home / Self Care | Attending: Surgery | Admitting: Surgery

## 2022-12-29 ENCOUNTER — Ambulatory Visit (HOSPITAL_COMMUNITY): Payer: Medicare Other | Admitting: Vascular Surgery

## 2022-12-29 DIAGNOSIS — K429 Umbilical hernia without obstruction or gangrene: Secondary | ICD-10-CM

## 2022-12-29 DIAGNOSIS — E119 Type 2 diabetes mellitus without complications: Secondary | ICD-10-CM | POA: Diagnosis not present

## 2022-12-29 DIAGNOSIS — K4090 Unilateral inguinal hernia, without obstruction or gangrene, not specified as recurrent: Secondary | ICD-10-CM | POA: Diagnosis not present

## 2022-12-29 DIAGNOSIS — Z7982 Long term (current) use of aspirin: Secondary | ICD-10-CM | POA: Insufficient documentation

## 2022-12-29 HISTORY — PX: UMBILICAL HERNIA REPAIR: SHX196

## 2022-12-29 HISTORY — DX: Essential (primary) hypertension: I10

## 2022-12-29 HISTORY — PX: INGUINAL HERNIA REPAIR: SHX194

## 2022-12-29 LAB — GLUCOSE, CAPILLARY
Glucose-Capillary: 95 mg/dL (ref 70–99)
Glucose-Capillary: 113 mg/dL — ABNORMAL HIGH (ref 70–99)

## 2022-12-29 SURGERY — REPAIR, HERNIA, UMBILICAL, ADULT
Anesthesia: General

## 2022-12-29 MED ORDER — MIDAZOLAM HCL 5 MG/5ML IJ SOLN
INTRAMUSCULAR | Status: DC | PRN
  Administered 2022-12-29: 2 mg via INTRAVENOUS

## 2022-12-29 MED ORDER — OXYCODONE HCL 5 MG PO TABS: 5.0000 mg | ORAL_TABLET | Freq: Four times a day (QID) | ORAL | 0 refills | Status: AC | PRN

## 2022-12-29 MED ORDER — BUPIVACAINE-EPINEPHRINE 0.25% -1:200000 IJ SOLN
INTRAMUSCULAR | Status: AC
  Filled 2022-12-29: qty 1

## 2022-12-29 MED ORDER — BUPIVACAINE-EPINEPHRINE 0.25% -1:200000 IJ SOLN
INTRAMUSCULAR | Status: DC | PRN
  Administered 2022-12-29: 20 mL

## 2022-12-29 MED ORDER — FENTANYL CITRATE (PF) 250 MCG/5ML IJ SOLN
INTRAMUSCULAR | Status: AC
  Filled 2022-12-29: qty 5

## 2022-12-29 MED ORDER — ROCURONIUM BROMIDE 100 MG/10ML IV SOLN
INTRAVENOUS | Status: DC | PRN
  Administered 2022-12-29: 60 mg via INTRAVENOUS

## 2022-12-29 MED ORDER — BUPIVACAINE LIPOSOME 1.3 % IJ SUSP
INTRAMUSCULAR | Status: AC
  Filled 2022-12-29: qty 20

## 2022-12-29 MED ORDER — DEXAMETHASONE SODIUM PHOSPHATE 10 MG/ML IJ SOLN
INTRAMUSCULAR | Status: DC | PRN
  Administered 2022-12-29: 4 mg via INTRAVENOUS

## 2022-12-29 MED ORDER — HYDRALAZINE HCL 20 MG/ML IJ SOLN: 5.0000 mg | Freq: Once | INTRAMUSCULAR | Status: AC

## 2022-12-29 MED ORDER — CELECOXIB 200 MG PO CAPS
200.0000 mg | ORAL_CAPSULE | ORAL | Status: AC
  Administered 2022-12-29: 200 mg via ORAL
  Filled 2022-12-29: qty 1

## 2022-12-29 MED ORDER — HYDROMORPHONE HCL 1 MG/ML IJ SOLN
INTRAMUSCULAR | Status: AC
  Administered 2022-12-29: 0.5 mg via INTRAVENOUS
  Filled 2022-12-29: qty 1

## 2022-12-29 MED ORDER — LIDOCAINE HCL (CARDIAC) PF 100 MG/5ML IV SOSY
PREFILLED_SYRINGE | INTRAVENOUS | Status: DC | PRN
  Administered 2022-12-29: 60 mg via INTRAVENOUS

## 2022-12-29 MED ORDER — LIDOCAINE HCL (PF) 2 % IJ SOLN
INTRAMUSCULAR | Status: AC
  Filled 2022-12-29: qty 5

## 2022-12-29 MED ORDER — ORAL CARE MOUTH RINSE: 15.0000 mL | Freq: Once | OROMUCOSAL | Status: AC

## 2022-12-29 MED ORDER — CHLORHEXIDINE GLUCONATE 0.12 % MT SOLN
15.0000 mL | Freq: Once | OROMUCOSAL | Status: AC
  Administered 2022-12-29: 15 mL via OROMUCOSAL

## 2022-12-29 MED ORDER — PROPOFOL 10 MG/ML IV BOLUS
INTRAVENOUS | Status: DC | PRN
Start: 2022-12-29 — End: 2022-12-29
  Administered 2022-12-29: 150 mg via INTRAVENOUS
  Administered 2022-12-29: 50 mg via INTRAVENOUS

## 2022-12-29 MED ORDER — ACETAMINOPHEN 500 MG PO TABS
1000.0000 mg | ORAL_TABLET | ORAL | Status: AC
  Administered 2022-12-29: 1000 mg via ORAL
  Filled 2022-12-29: qty 2

## 2022-12-29 MED ORDER — SUGAMMADEX SODIUM 200 MG/2ML IV SOLN
INTRAVENOUS | Status: DC | PRN
  Administered 2022-12-29: 200 mg via INTRAVENOUS

## 2022-12-29 MED ORDER — ONDANSETRON HCL 4 MG/2ML IJ SOLN
INTRAMUSCULAR | Status: AC
  Filled 2022-12-29: qty 2

## 2022-12-29 MED ORDER — 0.9 % SODIUM CHLORIDE (POUR BTL) OPTIME
TOPICAL | Status: DC | PRN
  Administered 2022-12-29: 1000 mL

## 2022-12-29 MED ORDER — MIDAZOLAM HCL 2 MG/2ML IJ SOLN
INTRAMUSCULAR | Status: AC
  Filled 2022-12-29: qty 2

## 2022-12-29 MED ORDER — PROPOFOL 10 MG/ML IV BOLUS
INTRAVENOUS | Status: AC
  Filled 2022-12-29: qty 20

## 2022-12-29 MED ORDER — LACTATED RINGERS IV SOLN: INTRAVENOUS | Status: DC

## 2022-12-29 MED ORDER — INSULIN ASPART 100 UNIT/ML IJ SOLN: 0.0000 [IU] | INTRAMUSCULAR | Status: DC | PRN

## 2022-12-29 MED ORDER — DEXAMETHASONE SODIUM PHOSPHATE 10 MG/ML IJ SOLN
INTRAMUSCULAR | Status: AC
  Filled 2022-12-29: qty 1

## 2022-12-29 MED ORDER — HYDRALAZINE HCL 20 MG/ML IJ SOLN
5.0000 mg | Freq: Once | INTRAMUSCULAR | Status: AC
  Administered 2022-12-29: 5 mg via INTRAVENOUS

## 2022-12-29 MED ORDER — HYDRALAZINE HCL 20 MG/ML IJ SOLN
INTRAMUSCULAR | Status: AC
  Administered 2022-12-29: 5 mg via INTRAVENOUS
  Filled 2022-12-29: qty 1

## 2022-12-29 MED ORDER — ROCURONIUM BROMIDE 10 MG/ML (PF) SYRINGE
PREFILLED_SYRINGE | INTRAVENOUS | Status: AC
  Filled 2022-12-29: qty 10

## 2022-12-29 MED ORDER — BUPIVACAINE LIPOSOME 1.3 % IJ SUSP
INTRAMUSCULAR | Status: DC | PRN
  Administered 2022-12-29: 20 mL

## 2022-12-29 MED ORDER — ONDANSETRON HCL 4 MG/2ML IJ SOLN
INTRAMUSCULAR | Status: DC | PRN
  Administered 2022-12-29: 4 mg via INTRAVENOUS

## 2022-12-29 MED ORDER — HYDROMORPHONE HCL 1 MG/ML IJ SOLN
0.2500 mg | INTRAMUSCULAR | Status: DC | PRN
  Administered 2022-12-29 (×2): 0.5 mg via INTRAVENOUS

## 2022-12-29 MED ORDER — FENTANYL CITRATE (PF) 100 MCG/2ML IJ SOLN
INTRAMUSCULAR | Status: DC | PRN
  Administered 2022-12-29 (×3): 50 ug via INTRAVENOUS

## 2022-12-29 MED ORDER — CEFAZOLIN SODIUM-DEXTROSE 2-4 GM/100ML-% IV SOLN
2.0000 g | INTRAVENOUS | Status: AC
  Administered 2022-12-29: 2 g via INTRAVENOUS
  Filled 2022-12-29: qty 100

## 2022-12-29 SURGICAL SUPPLY — 47 items
ADH SKN CLS APL DERMABOND .7 (GAUZE/BANDAGES/DRESSINGS) ×2
APL PRP STRL LF DISP 70% ISPRP (MISCELLANEOUS) ×1
BAG COUNTER SPONGE SURGICOUNT (BAG) IMPLANT
BAG SPNG CNTER NS LX DISP (BAG)
BINDER ABDOMINAL 12 ML 46-62 (SOFTGOODS) ×1 IMPLANT
BLADE SURG 15 STRL LF DISP TIS (BLADE) ×1 IMPLANT
BLADE SURG 15 STRL SS (BLADE) ×1
CHLORAPREP W/TINT 26 (MISCELLANEOUS) ×1 IMPLANT
COVER SURGICAL LIGHT HANDLE (MISCELLANEOUS) ×1 IMPLANT
DERMABOND ADVANCED .7 DNX12 (GAUZE/BANDAGES/DRESSINGS) ×1 IMPLANT
DRAIN CHANNEL 19F RND (DRAIN) IMPLANT
DRAIN PENROSE 0.5X18 (DRAIN) ×1 IMPLANT
DRAPE LAPAROSCOPIC ABDOMINAL (DRAPES) ×1 IMPLANT
DRAPE UTILITY XL STRL (DRAPES) ×1 IMPLANT
ELECT PENCIL ROCKER SW 15FT (MISCELLANEOUS) IMPLANT
ELECT REM PT RETURN 15FT ADLT (MISCELLANEOUS) ×1 IMPLANT
EVACUATOR SILICONE 100CC (DRAIN) IMPLANT
GAUZE SPONGE 4X4 12PLY STRL (GAUZE/BANDAGES/DRESSINGS) IMPLANT
GLOVE BIO SURGEON STRL SZ 6 (GLOVE) ×1 IMPLANT
GLOVE BIOGEL PI IND STRL 6 (GLOVE) ×1 IMPLANT
GLOVE BIOGEL PI MICRO STRL 5.5 (GLOVE) ×1 IMPLANT
GLOVE SS PI 5.5 STRL (GLOVE) ×1 IMPLANT
GLOVE SURG POLYISO LF SZ6 (GLOVE) ×1 IMPLANT
GOWN STRL REUS W/ TWL LRG LVL3 (GOWN DISPOSABLE) ×1 IMPLANT
GOWN STRL REUS W/TWL LRG LVL3 (GOWN DISPOSABLE) ×1
KIT BASIN OR (CUSTOM PROCEDURE TRAY) ×1 IMPLANT
KIT TURNOVER KIT A (KITS) IMPLANT
MARKER SKIN DUAL TIP RULER LAB (MISCELLANEOUS) ×1 IMPLANT
MESH ULTRAPRO 3X6 7.6X15CM (Mesh General) IMPLANT
MESH VENTRALEX ST 8CM LRG (Mesh General) IMPLANT
NDL HYPO 22X1.5 SAFETY MO (MISCELLANEOUS) IMPLANT
NEEDLE HYPO 22X1.5 SAFETY MO (MISCELLANEOUS) ×1
PACK BASIC VI WITH GOWN DISP (CUSTOM PROCEDURE TRAY) ×1 IMPLANT
PACK GENERAL/GYN (CUSTOM PROCEDURE TRAY) ×1 IMPLANT
SPIKE FLUID TRANSFER (MISCELLANEOUS) ×1 IMPLANT
SPONGE DRAIN TRACH 4X4 STRL 2S (GAUZE/BANDAGES/DRESSINGS) IMPLANT
SPONGE T-LAP 4X18 ~~LOC~~+RFID (SPONGE) ×1 IMPLANT
SUT ETHILON 2 0 PS N (SUTURE) IMPLANT
SUT MNCRL AB 4-0 PS2 18 (SUTURE) ×1 IMPLANT
SUT NOVA NAB DX-16 0-1 5-0 T12 (SUTURE) ×1 IMPLANT
SUT PDS AB 0 CT1 36 (SUTURE) ×2 IMPLANT
SUT VIC AB 3-0 SH 27 (SUTURE) ×3
SUT VIC AB 3-0 SH 27X BRD (SUTURE) ×2 IMPLANT
SYR BULB IRRIG 60ML STRL (SYRINGE) ×1 IMPLANT
SYR CONTROL 10ML LL (SYRINGE) IMPLANT
TOWEL OR 17X26 10 PK STRL BLUE (TOWEL DISPOSABLE) ×1 IMPLANT
TOWEL OR NON WOVEN STRL DISP B (DISPOSABLE) ×1 IMPLANT

## 2022-12-29 NOTE — Interval H&P Note (Signed)
History and Physical Interval Note:  12/29/2022 11:24 AM  Daniel Mcdowell  has presented today for surgery, with the diagnosis of UMBILICAL HERNIA LEFT INGUINAL HERNIA.  The various methods of treatment have been discussed with the patient and family. After consideration of risks, benefits and other options for treatment, the patient has consented to  Procedure(s): HERNIA REPAIR UMBILICAL ADULT WITH MESH (N/A) OPEN LEFT INGUINAL HERNIA REPAIR WITH MESH (N/A) as a surgical intervention.  The patient's history has been reviewed, patient examined, no change in status, stable for surgery.  I have reviewed the patient's chart and labs.  Questions were answered to the patient's satisfaction.     Fritzi Mandes

## 2022-12-29 NOTE — Op Note (Signed)
Date: 12/29/22  Patient: Daniel Mcdowell MRN: [id number]  Preoperative Diagnosis: Supraumbilical hernia, left inguinal hernia Postoperative Diagnosis: Supraumbilical and umbilical hernia, direct left inguinal hernia  Procedure: Open repair of supraumbilical/umbilical hernia with mesh underlay Open repair of left inguinal hernia with mesh patch  Surgeon: Sophronia Simas, MD Assistant: Clabe Seal, MD (Resident)  EBL: Minimal  Anesthesia: General endotracheal  Specimens: None  Indications: Mr. Daniel Mcdowell is a 72 yo male who presented with an increasingly symptomatic and painful umbilical hernia, for which he desired repair. He also had a large left inguinal hernia. After a discussion of the risks and benefits of surgery, he elected to proceed with repair of both hernias.  Findings: Supraumbilical hernia, 4cm fascial defect noted on preoperative imaging. An additional small umbilical defect was noted intra-op. Both hernia defects were opened to create a single fascial defect measuring 4cm by 3.5cm, which was repaired with an 8cm circular mesh underlay. Large direct left inguinal hernia was repaired with an Ultrapro mesh patch.  Procedure details: Informed consent was obtained in the preoperative area prior to the procedure. The patient was brought to the operating room and placed on the table in the supine position. General anesthesia was induced and appropriate lines and drains were placed for intraoperative monitoring. Perioperative antibiotics were administered per SCIP guidelines. The abdomen and groins were prepped and draped in the usual sterile fashion. A pre-procedure timeout was taken verifying patient identity, surgical site and procedure to be performed.  A vertical supraumbilical skin incision was made and the subcutaneous tissue was divided with cautery. A hernia sac was encountered and dissected out of the subcutaneous tissue using blunt dissection and cautery. The sac was opened with  cautery and dissected off the fascia circumferentially with cautery. The sac was excised and discarded. The surrounding fascia was palpated and was free of any adhesions, however an additional small hernia defect was palpated at the base of the umbilical stalk.  There was an approximately 1 cm fascial bridge between this defect and the supraumbilical defect.  The fascial bridge was divided with cautery.  The hernia sac was dissected off the umbilical stalk using cautery.  The resulting single fascial defect was measured at 4 cm wide by 3.5 cm in length.  An 8 inch circular Ventralex mesh was brought onto the field.  The mesh was tacked to the fascia at the 4 cardinal points as an underlay using transfascial 1 Novafil sutures.  The sutures were pulled up to bring the mesh flush with the abdominal wall, and the edges were palpated to ensure no bowel had become entrapped between the mesh and abdominal wall.  The sutures were then tied down and cut.  The fascia was then closed over the mesh transversely using interrupted 1 Novafil sutures.  The wound appeared hemostatic.  The umbilical stalk was tacked down to the fascia using a 3-0 Vicryl suture.  Scarpa's layer was closed with a running 3-0 Vicryl suture, and the skin was closed with a running subcuticular 4 Monocryl suture.  Dermabond was applied.  A transverse incision was made in the left groin two fingerbreadths superior to the pubic tubercle. The subcutaneous tissue and Scarpa's fascia were divided with cautery to expose the external oblique fascia. The fascia was incised with a 15-blade scalpel and opened medially to the external inguinal ring using metzenbaum scissors, taking care to protect the ilioinguinal nerve. The cord structures were circumferentially dissected out using gentle blunt dissection, and encircled with a penrose. The ilioinguinal nerve  was visualized and protected. An large direct hernia sac was identified and dissected off the cord  structures using blunt dissection and cautery, taking care to protect the vas deferens. The sac and its contents were then reduced into the abdomen. The inguinal floor was completely obliterated, so a tissue closure over the hernia sac was first performed prior to placement of the mesh. The conjoined tendon was sutured to the shelving edge of the inguinal ligament using interrupted 0 PDS sutures. A sheet of Ultrapro mesh was then brought onto the field and cut to size, with a slit to accommodate the cord structures. The mesh was secured medially to the pubic tubercle and inferiorly to the shelving edge of the inguinal ligament, using a running 0 PDS suture. Superiorly the mesh was secured to the conjoined tendon using interrupted 0 PDS. The tails of the mesh were sutured together laterally to recreate the internal inguinal ring. The surgical site was thoroughly irrigated with saline and appeared hemostatic. The external oblique fascia was closed with a running 3-0 Vicryl suture. Scarpa's fascia was closed a running 3-0 Vicryl, and the deep dermis was reapproximated with interrupted 3-0 Vicryl sutures. The skin was closed with a running subcuticular 4-0 monocryl suture. Dermabond was applied.  The patient tolerated the procedure well with no apparent complications. All counts were correct x2 at the end of the procedure. The patient was extubated and taken to PACU in stable condition.  Sophronia Simas, MD 12/29/22 2:16 PM

## 2022-12-29 NOTE — Transfer of Care (Signed)
Immediate Anesthesia Transfer of Care Note  Patient: Juddson Mascarenas  Procedure(s) Performed: HERNIA REPAIR UMBILICAL ADULT WITH MESH OPEN LEFT INGUINAL HERNIA REPAIR WITH MESH  Patient Location: PACU  Anesthesia Type:General  Level of Consciousness: awake, alert , drowsy, and pateint uncooperative  Airway & Oxygen Therapy: Patient Spontanous Breathing and Patient connected to face mask oxygen  Post-op Assessment: Report given to RN, Post -op Vital signs reviewed and stable, and Patient moving all extremities  Post vital signs: Reviewed and stable  Last Vitals:  Vitals Value Taken Time  BP    Temp    Pulse    Resp    SpO2      Last Pain:  Vitals:   12/29/22 0953  TempSrc:   PainSc: 4       Patients Stated Pain Goal: 3 (12/29/22 0953)  Complications: No notable events documented.

## 2022-12-29 NOTE — Anesthesia Procedure Notes (Signed)
Procedure Name: Intubation Date/Time: 12/29/2022 12:16 PM  Performed by: Johnette Abraham, CRNAPre-anesthesia Checklist: Patient identified, Emergency Drugs available, Suction available and Patient being monitored Patient Re-evaluated:Patient Re-evaluated prior to induction Oxygen Delivery Method: Circle System Utilized Preoxygenation: Pre-oxygenation with 100% oxygen Induction Type: IV induction Ventilation: Mask ventilation without difficulty Laryngoscope Size: Glidescope and 4 Grade View: Grade III Tube type: Oral Tube size: 8.0 mm Number of attempts: 3 Airway Equipment and Method: Stylet and Oral airway Placement Confirmation: ETT inserted through vocal cords under direct vision, positive ETCO2 and breath sounds checked- equal and bilateral Secured at: 23 cm Tube secured with: Tape Dental Injury: Teeth and Oropharynx as per pre-operative assessment

## 2022-12-29 NOTE — Discharge Instructions (Addendum)
[facility] DISCHARGE INSTRUCTIONS  Activity No heavy lifting greater than 15 pounds for 8 weeks after surgery. Ok to shower in 48 hours, but do not bathe or submerge incisions underwater. Do not drive while taking narcotic pain medication. You may drive when you are no longer taking prescription pain medication, you can comfortably wear a seatbelt, and you can safely maneuver your car and apply brakes.  Wound Care Your incisions are covered with skin glue called Dermabond. This will peel off on its own over time. You may shower and allow warm soapy water to run over your incisions in 48 hours after surgery. Gently pat dry. Do not submerge your incision underwater until cleared by your surgeon. Monitor your incision for any new redness, tenderness, or drainage. Many patients will experience some swelling and bruising at the incisions.  Ice packs will help.  Swelling and bruising can take several days to resolve.   Medications A  prescription for pain medication may be given to you upon discharge.  Take your pain medication as prescribed, if needed.  If narcotic pain medicine is not needed, then you may take acetaminophen (Tylenol) or ibuprofen (Advil) as needed. It is common to experience some constipation if taking pain medication after surgery.  Increasing fluid intake and taking a stool softener (such as Colace) will usually help or prevent this problem from occurring.  A mild laxative (Milk of Magnesia or Miralax) should be taken according to package directions if there are no bowel movements after 48 hours. Take your usually prescribed medications unless otherwise directed. If you need a refill on your pain medication, please contact your pharmacy.  They will contact our office to request authorization. Prescriptions will not be filled after 5 pm or on weekends.  When to Call us: Fever greater than 100.5 New redness, drainage, or swelling at incision site Severe pain,  nausea, or vomiting Persistent bleeding from incisions  Follow-up You have an appointment scheduled with Dr. Freida Busman on January 16, 2023 at 9:20am. This will be at the [facility] Surgery office at [address], [address], [place], [place]. Please arrive at least 15 minutes prior to your scheduled appointment time.  IF YOU HAVE DISABILITY OR FAMILY LEAVE FORMS, YOU MUST BRING THEM TO THE OFFICE FOR PROCESSING.   DO NOT GIVE THEM TO YOUR DOCTOR.  The clinic staff is available to answer your questions during regular business hours.  Please don't hesitate to call and ask to speak to one of the nurses for clinical concerns.  If you have a medical emergency, go to the nearest emergency room or call 911.  A surgeon from [facility] is always on call at the hospital  [address], [place], [place]  [address] ?  [address] [address], [place], [place]   [phone] ? Toll Free: [phone] ? FAX [phone] Web site: [email or web address]      Managing Your Pain After Surgery Without Opioids    Thank you for participating in our program to help patients manage their pain after surgery without opioids. This is part of our effort to provide you with the best care possible, without exposing you or your family to the risk that opioids pose.  What pain can I expect after surgery? You can expect to have some pain after surgery. This is normal. The pain is typically worse the day after surgery, and quickly begins to get better. Many studies have found that many patients are  able to manage their pain after surgery with Over-the-Counter (OTC) medications such as Tylenol and Motrin. If you have a condition that does not allow you to take Tylenol or Motrin, notify your surgical team.  How will I manage my pain? The best strategy for controlling your pain after surgery is around the clock pain control with Tylenol (acetaminophen) and Motrin (ibuprofen or  Advil). Alternating these medications with each other allows you to maximize your pain control. In addition to Tylenol and Motrin, you can use heating pads or ice packs on your incisions to help reduce your pain.  How will I alternate your regular strength over-the-counter pain medication? You will take a dose of pain medication every three hours. Start by taking 650 mg of Tylenol (2 pills of 325 mg) 3 hours later take 600 mg of Motrin (3 pills of 200 mg) 3 hours after taking the Motrin take 650 mg of Tylenol 3 hours after that take 600 mg of Motrin.   - 1 -  See example - if your first dose of Tylenol is at 12:00 PM   12:00 PM Tylenol 650 mg (2 pills of 325 mg)  3:00 PM Motrin 600 mg (3 pills of 200 mg)  6:00 PM Tylenol 650 mg (2 pills of 325 mg)  9:00 PM Motrin 600 mg (3 pills of 200 mg)  Continue alternating every 3 hours   We recommend that you follow this schedule around-the-clock for at least 3 days after surgery, or until you feel that it is no longer needed. Use the table on the last page of this handout to keep track of the medications you are taking. Important: Do not take more than 3000mg  of Tylenol or 3200mg  of Motrin in a 24-hour period. Do not take ibuprofen/Motrin if you have a history of bleeding stomach ulcers, severe kidney disease, &/or actively taking a blood thinner  What if I still have pain? If you have pain that is not controlled with the over-the-counter pain medications (Tylenol and Motrin or Advil) you might have what we call "breakthrough" pain. You will receive a prescription for a small amount of an opioid pain medication such as Oxycodone, Tramadol, or Tylenol with Codeine. Use these opioid pills in the first 24 hours after surgery if you have breakthrough pain. Do not take more than 1 pill every 4-6 hours.  If you still have uncontrolled pain after using all opioid pills, don't hesitate to call our staff using the number provided. We will help make sure  you are managing your pain in the best way possible, and if necessary, we can provide a prescription for additional pain medication.   Day 1    Time  Name of Medication Number of pills taken  Amount of Acetaminophen  Pain Level   Comments  AM PM       AM PM       AM PM       AM PM       AM PM       AM PM       AM PM       AM PM       Total Daily amount of Acetaminophen Do not take more than  3,000 mg per day      Day 2    Time  Name of Medication Number of pills taken  Amount of Acetaminophen  Pain Level   Comments  AM PM       AM PM  AM PM       AM PM       AM PM       AM PM       AM PM       AM PM       Total Daily amount of Acetaminophen Do not take more than  3,000 mg per day      Day 3    Time  Name of Medication Number of pills taken  Amount of Acetaminophen  Pain Level   Comments  AM PM       AM PM       AM PM       AM PM         AM PM       AM PM       AM PM       AM PM       Total Daily amount of Acetaminophen Do not take more than  3,000 mg per day      Day 4    Time  Name of Medication Number of pills taken  Amount of Acetaminophen  Pain Level   Comments  AM PM       AM PM       AM PM       AM PM       AM PM       AM PM       AM PM       AM PM       Total Daily amount of Acetaminophen Do not take more than  3,000 mg per day      Day 5    Time  Name of Medication Number of pills taken  Amount of Acetaminophen  Pain Level   Comments  AM PM       AM PM       AM PM       AM PM       AM PM       AM PM       AM PM       AM PM       Total Daily amount of Acetaminophen Do not take more than  3,000 mg per day      Day 6    Time  Name of Medication Number of pills taken  Amount of Acetaminophen  Pain Level  Comments  AM PM       AM PM       AM PM       AM PM       AM PM       AM PM       AM PM       AM PM       Total Daily amount of Acetaminophen Do not take more than  3,000 mg per day       Day 7    Time  Name of Medication Number of pills taken  Amount of Acetaminophen  Pain Level   Comments  AM PM       AM PM       AM PM       AM PM       AM PM       AM PM       AM PM       AM PM       Total Daily amount of Acetaminophen  Do not take more than  3,000 mg per day        For additional information about how and where to safely dispose of unused opioid medications - [email or web address]  Disclaimer: This document contains information and/or instructional materials adapted from [facility] for the typical patient with your condition. It does not replace medical advice from your health care provider because your experience may differ from that of the typical patient. Talk to your health care provider if you have any questions about this document, your condition or your treatment plan. Adapted from [facility]

## 2022-12-31 ENCOUNTER — Encounter (HOSPITAL_COMMUNITY): Payer: Self-pay | Admitting: Surgery

## 2023-01-01 NOTE — Anesthesia Postprocedure Evaluation (Signed)
Anesthesia Post Note  Patient: Jawann Hlavinka  Procedure(s) Performed: HERNIA REPAIR UMBILICAL ADULT WITH MESH OPEN LEFT INGUINAL HERNIA REPAIR WITH MESH     Patient location during evaluation: PACU Anesthesia Type: General Level of consciousness: awake and alert Pain management: pain level controlled Vital Signs Assessment: post-procedure vital signs reviewed and stable Respiratory status: spontaneous breathing, nonlabored ventilation and respiratory function stable Cardiovascular status: blood pressure returned to baseline and stable Postop Assessment: no apparent nausea or vomiting Anesthetic complications: no  No notable events documented.  Last Vitals:  Vitals:   12/29/22 1604 12/29/22 1610  BP: (!) 151/92 (!) 145/89  Pulse: 64 63  Resp: 13 18  Temp:  36.6 C  SpO2: 97% 92%    Last Pain:  Vitals:   12/29/22 1610  TempSrc: Oral  PainSc: 0-No pain                 Rula Keniston,W. EDMOND

## 2023-08-05 ENCOUNTER — Encounter (HOSPITAL_COMMUNITY): Payer: Self-pay

## 2023-08-05 ENCOUNTER — Ambulatory Visit (HOSPITAL_COMMUNITY): Admission: EM | Admit: 2023-08-05 | Discharge: 2023-08-05 | Disposition: A | Discharge status: Home / Self Care

## 2023-08-05 DIAGNOSIS — Z20822 Contact with and (suspected) exposure to covid-19: Secondary | ICD-10-CM

## 2023-08-05 LAB — POC COVID19/FLU A&B COMBO
Covid Antigen, POC: NEGATIVE
Influenza A Antigen, POC: NEGATIVE
Influenza B Antigen, POC: NEGATIVE

## 2023-08-05 NOTE — ED Provider Notes (Signed)
 [facility]    CSN: [id number] Arrival date & time: 08/05/23  1548      History   Chief Complaint Chief Complaint  Patient presents with   Covid Exposure    HPI Daniel Mcdowell is a 73 y.o. male.   Here for COVID testing after exposure from wife's aunt who lives in assisted living. No symptoms at this time. Wife is immunocompromised.Has white coat syndrome. Takes BP twice per day, at home BP well controlled.  Would like to be tested as they have events coming up. Vaccines are up to date.   The history is provided by the patient.    Past Medical History:  Diagnosis Date   Diabetes mellitus without complication (HCC)    type 2 - on metformin   GERD (gastroesophageal reflux disease)    Hemorrhoids    History of kidney stones    nephrolithiasis - surgery to remove   HLD (hyperlipidemia)    Hypertension    White coat syndrome with hypertension     Patient Active Problem List   Diagnosis Date Noted   Acute blood loss anemia    Syncope    Orthostatic hypotension    Near syncope 04/03/2019   Essential hypertension 04/03/2019   Hematoma of left hip 04/03/2019   History of nephrolithiasis 04/03/2019    Past Surgical History:  Procedure Laterality Date   COLONOSCOPY     at age 36 yrs   INGUINAL HERNIA REPAIR N/A 12/29/2022   Procedure: OPEN LEFT INGUINAL HERNIA REPAIR WITH MESH;  Surgeon: Fritzi Mandes, MD;  Location: [facility];  Service: General;  Laterality: N/A;   LITHOTRIPSY     UMBILICAL HERNIA REPAIR N/A 12/29/2022   Procedure: HERNIA REPAIR UMBILICAL ADULT WITH MESH;  Surgeon: Fritzi Mandes, MD;  Location: [facility];  Service: General;  Laterality: N/A;   wisdom tooth removal         Home Medications    Prior to Admission medications   Medication Sig Start Date End Date Taking? Authorizing Provider  aspirin EC 81 MG tablet Take 81 mg by mouth daily. Swallow whole.   Yes [provider]  atorvastatin (LIPITOR) 20 MG tablet Take 20 mg by mouth  at bedtime. 11/02/22  Yes [provider]  carvedilol (COREG) 25 MG tablet Take 25 mg by mouth 2 (two) times daily. 10/24/22  Yes [provider]  Cholecalciferol (VITAMIN D) 50 MCG (2000 UT) tablet Take 2,000 Units by mouth daily.   Yes [provider]  CINNAMON PO Take 1,000 mg by mouth 2 (two) times daily.   Yes [provider]  cloNIDine (CATAPRES) 0.2 MG tablet Take 0.2 mg by mouth at bedtime. 07/23/23  Yes [provider]  doxazosin (CARDURA) 2 MG tablet Take 2 mg by mouth at bedtime. 10/13/22  Yes [provider]  lisinopril (ZESTRIL) 40 MG tablet Take 40 mg by mouth daily. 03/04/19  Yes [provider]  metFORMIN (GLUCOPHAGE-XR) 500 MG 24 hr tablet Take 500 mg by mouth 2 (two) times daily. 11/09/22  Yes [provider]  omeprazole (PRILOSEC) 20 MG capsule Take 20 mg by mouth daily.   Yes [provider]  Prenatal MV-Min-Fe Fum-FA-DHA (PRENATAL/FOLIC ACID+DHA PO) Take 1 capsule by mouth daily.   Yes [provider]    Family History Family History  Problem Relation Age of Onset   Heart attack Father    Diabetes Sister    COPD Sister     Social History  Social History   Tobacco Use   Smoking status: Never   Smokeless tobacco: Never  Vaping Use   Vaping status: Never Used  Substance Use Topics   Alcohol use: Not Currently    Comment: 1 beer every 6 months or so    Drug use: Never     Allergies   Patient has no known allergies.   Review of Systems Review of Systems   Physical Exam Triage Vital Signs ED Triage Vitals  Encounter Vitals Group     BP 08/05/23 1649 (!) 208/98     Systolic BP Percentile --      Diastolic BP Percentile --      Pulse Rate 08/05/23 1649 65     Resp 08/05/23 1649 18     Temp 08/05/23 1649 97.9 F (36.6 C)     Temp Source 08/05/23 1649 Oral     SpO2 08/05/23 1649 96 %     Weight 08/05/23 1648 220 lb 14.4 oz (100.2 kg)     Height 08/05/23 1648 5\' 9"   (1.753 m)     Head Circumference --      Peak Flow --      Pain Score 08/05/23 1646 0     Pain Loc --      Pain Education --      Exclude from Growth Chart --    No data found.  Updated Vital Signs BP (!) 208/98 (BP Location: Left Arm)   Pulse 65   Temp 97.9 F (36.6 C) (Oral)   Resp 18   Ht 5\' 9"  (1.753 m)   Wt 220 lb 14.4 oz (100.2 kg)   SpO2 96%   BMI 32.62 kg/m   Visual Acuity Right Eye Distance:   Left Eye Distance:   Bilateral Distance:    Right Eye Near:   Left Eye Near:    Bilateral Near:     Physical Exam   UC Treatments / Results  Labs (all labs ordered are listed, but only abnormal results are displayed) Labs Reviewed  POC COVID19/FLU A&B COMBO    EKG   Radiology No results found.  Procedures Procedures (including critical care time)  Medications Ordered in UC Medications - No data to display  Initial Impression / Assessment and Plan / UC Course  I have reviewed the triage vital signs and the nursing notes.  Pertinent labs & imaging results that were available during my care of the patient were reviewed by me and considered in my medical decision making (see chart for details).   Viral Testing: Negative Patient will return to clinic if he develops any symptoms.  At this time he is negative for COVID-19 and flu and does not need any follow-up   Final Clinical Impressions(s) / UC Diagnoses   Final diagnoses:  Encounter for laboratory testing for COVID-19 virus     Discharge Instructions      Your viral testing today was negative Continue your health maintenance as you are. If you develop any symptoms please come back to urgent care or follow up with PCP       ED Prescriptions   None    PDMP not reviewed this encounter.   Elmer Picker, NP 08/05/23 [phone]

## 2023-08-05 NOTE — ED Triage Notes (Signed)
 Chief Complaint: was told this morning that his wife's aunt tested positive for Covid. They were with her this past Tuesday. Patient's wife is sick as well but not having any symptoms.   Sick exposure: Yes- Covid exposure.   Onset: this past Tuesday   Prescriptions or OTC medications tried: No    New foods, medications, or products: No  Recent Travel: No

## 2023-08-05 NOTE — Discharge Instructions (Addendum)
 Your viral testing today was negative Continue your health maintenance as you are. If you develop any symptoms please come back to urgent care or follow up with PCP
